# Patient Record
Sex: Female | Born: 1935 | Race: Black or African American | Hispanic: No | Smoking: Never smoker
Health system: Southern US, Community
[De-identification: ages and names within clinical notes are randomized; demographics above are authoritative.]

## PROBLEM LIST (undated history)

## (undated) DIAGNOSIS — I10 Essential (primary) hypertension: Secondary | ICD-10-CM

## (undated) DIAGNOSIS — I441 Atrioventricular block, second degree: Secondary | ICD-10-CM

## (undated) DIAGNOSIS — E119 Type 2 diabetes mellitus without complications: Secondary | ICD-10-CM

## (undated) DIAGNOSIS — I452 Bifascicular block: Secondary | ICD-10-CM

## (undated) HISTORY — DX: Atrioventricular block, second degree: I44.1

## (undated) HISTORY — DX: Bifascicular block: I45.2

---

## 1998-03-07 ENCOUNTER — Ambulatory Visit (HOSPITAL_COMMUNITY): Admission: RE | Admit: 1998-03-07 | Discharge: 1998-03-07 | Payer: Self-pay | Admitting: Family Medicine

## 2000-01-05 ENCOUNTER — Other Ambulatory Visit: Admission: RE | Admit: 2000-01-05 | Discharge: 2000-01-05 | Payer: Self-pay | Admitting: Family Medicine

## 2000-01-11 ENCOUNTER — Encounter: Payer: Self-pay | Admitting: Family Medicine

## 2000-01-11 ENCOUNTER — Encounter: Admission: RE | Admit: 2000-01-11 | Discharge: 2000-01-11 | Payer: Self-pay | Admitting: Family Medicine

## 2000-01-12 ENCOUNTER — Encounter: Payer: Self-pay | Admitting: Family Medicine

## 2000-01-12 ENCOUNTER — Ambulatory Visit (HOSPITAL_COMMUNITY): Admission: RE | Admit: 2000-01-12 | Discharge: 2000-01-12 | Payer: Self-pay | Admitting: Family Medicine

## 2000-07-09 ENCOUNTER — Encounter: Payer: Self-pay | Admitting: Emergency Medicine

## 2000-07-09 ENCOUNTER — Emergency Department (HOSPITAL_COMMUNITY): Admission: EM | Admit: 2000-07-09 | Discharge: 2000-07-09 | Payer: Self-pay | Admitting: Emergency Medicine

## 2002-06-26 ENCOUNTER — Other Ambulatory Visit: Admission: RE | Admit: 2002-06-26 | Discharge: 2002-06-26 | Payer: Self-pay | Admitting: Family Medicine

## 2002-07-06 ENCOUNTER — Encounter: Admission: RE | Admit: 2002-07-06 | Discharge: 2002-07-06 | Payer: Self-pay | Admitting: Family Medicine

## 2002-07-06 ENCOUNTER — Encounter: Payer: Self-pay | Admitting: Family Medicine

## 2004-06-29 ENCOUNTER — Encounter: Admission: RE | Admit: 2004-06-29 | Discharge: 2004-06-29 | Payer: Self-pay | Admitting: Family Medicine

## 2004-06-29 ENCOUNTER — Other Ambulatory Visit: Admission: RE | Admit: 2004-06-29 | Discharge: 2004-06-29 | Payer: Self-pay | Admitting: Family Medicine

## 2004-07-10 ENCOUNTER — Ambulatory Visit: Payer: Self-pay | Admitting: Gastroenterology

## 2004-07-24 ENCOUNTER — Ambulatory Visit: Payer: Self-pay | Admitting: Gastroenterology

## 2004-08-15 ENCOUNTER — Ambulatory Visit (HOSPITAL_COMMUNITY): Admission: RE | Admit: 2004-08-15 | Discharge: 2004-08-15 | Payer: Self-pay | Admitting: Family Medicine

## 2004-09-22 ENCOUNTER — Encounter (INDEPENDENT_AMBULATORY_CARE_PROVIDER_SITE_OTHER): Payer: Self-pay | Admitting: *Deleted

## 2004-09-22 ENCOUNTER — Ambulatory Visit (HOSPITAL_COMMUNITY): Admission: RE | Admit: 2004-09-22 | Discharge: 2004-09-22 | Payer: Self-pay | Admitting: Obstetrics & Gynecology

## 2004-10-26 ENCOUNTER — Encounter: Admission: RE | Admit: 2004-10-26 | Discharge: 2004-10-26 | Payer: Self-pay | Admitting: Family Medicine

## 2005-07-23 ENCOUNTER — Encounter: Admission: RE | Admit: 2005-07-23 | Discharge: 2005-07-23 | Payer: Self-pay | Admitting: Family Medicine

## 2006-06-03 ENCOUNTER — Other Ambulatory Visit: Admission: RE | Admit: 2006-06-03 | Discharge: 2006-06-03 | Payer: Self-pay | Admitting: Family Medicine

## 2006-06-14 ENCOUNTER — Ambulatory Visit (HOSPITAL_COMMUNITY): Admission: RE | Admit: 2006-06-14 | Discharge: 2006-06-14 | Payer: Self-pay | Admitting: Family Medicine

## 2006-12-10 ENCOUNTER — Ambulatory Visit (HOSPITAL_COMMUNITY): Admission: RE | Admit: 2006-12-10 | Discharge: 2006-12-11 | Payer: Self-pay | Admitting: Ophthalmology

## 2007-07-21 ENCOUNTER — Encounter: Admission: RE | Admit: 2007-07-21 | Discharge: 2007-07-21 | Payer: Self-pay | Admitting: Family Medicine

## 2008-04-26 ENCOUNTER — Encounter: Admission: RE | Admit: 2008-04-26 | Discharge: 2008-04-26 | Payer: Self-pay | Admitting: Family Medicine

## 2008-05-04 ENCOUNTER — Encounter: Admission: RE | Admit: 2008-05-04 | Discharge: 2008-05-04 | Payer: Self-pay | Admitting: Family Medicine

## 2008-05-10 ENCOUNTER — Emergency Department (HOSPITAL_COMMUNITY): Admission: EM | Admit: 2008-05-10 | Discharge: 2008-05-10 | Payer: Self-pay | Admitting: Emergency Medicine

## 2009-02-07 ENCOUNTER — Ambulatory Visit (HOSPITAL_COMMUNITY): Admission: RE | Admit: 2009-02-07 | Discharge: 2009-02-07 | Payer: Self-pay

## 2009-06-18 ENCOUNTER — Emergency Department (HOSPITAL_COMMUNITY): Admission: EM | Admit: 2009-06-18 | Discharge: 2009-06-18 | Payer: Self-pay | Admitting: Family Medicine

## 2009-06-22 ENCOUNTER — Ambulatory Visit: Payer: Self-pay | Admitting: Gynecology

## 2009-06-27 ENCOUNTER — Ambulatory Visit: Payer: Self-pay | Admitting: Gynecology

## 2009-08-24 ENCOUNTER — Ambulatory Visit: Payer: Self-pay | Admitting: Oncology

## 2009-09-01 ENCOUNTER — Other Ambulatory Visit: Admission: RE | Admit: 2009-09-01 | Discharge: 2009-09-01 | Payer: Self-pay | Admitting: Oncology

## 2009-09-01 LAB — MORPHOLOGY

## 2009-09-01 LAB — CHCC SMEAR

## 2009-09-01 LAB — COMPREHENSIVE METABOLIC PANEL
Albumin: 4.3 g/dL (ref 3.5–5.2)
Alkaline Phosphatase: 76 U/L (ref 39–117)
BUN: 14 mg/dL (ref 6–23)
Calcium: 9.6 mg/dL (ref 8.4–10.5)
Creatinine, Ser: 1.04 mg/dL (ref 0.40–1.20)
Glucose, Bld: 121 mg/dL — ABNORMAL HIGH (ref 70–99)
Potassium: 3.8 mEq/L (ref 3.5–5.3)

## 2009-09-01 LAB — CBC WITH DIFFERENTIAL/PLATELET
Basophils Absolute: 0 10*3/uL (ref 0.0–0.1)
Eosinophils Absolute: 0.1 10*3/uL (ref 0.0–0.5)
HGB: 12.9 g/dL (ref 11.6–15.9)
MCV: 92.9 fL (ref 79.5–101.0)
MONO#: 0.5 10*3/uL (ref 0.1–0.9)
MONO%: 8.1 % (ref 0.0–14.0)
NEUT#: 2.1 10*3/uL (ref 1.5–6.5)
RBC: 4.14 10*6/uL (ref 3.70–5.45)
RDW: 14.6 % — ABNORMAL HIGH (ref 11.2–14.5)
WBC: 5.9 10*3/uL (ref 3.9–10.3)
lymph#: 3.2 10*3/uL (ref 0.9–3.3)

## 2009-09-02 LAB — FLOW CYTOMETRY

## 2009-12-13 ENCOUNTER — Emergency Department (HOSPITAL_COMMUNITY): Admission: EM | Admit: 2009-12-13 | Discharge: 2009-12-13 | Payer: Self-pay | Admitting: Emergency Medicine

## 2010-02-28 ENCOUNTER — Ambulatory Visit: Payer: Self-pay | Admitting: Oncology

## 2010-05-28 ENCOUNTER — Encounter: Payer: Self-pay | Admitting: Family Medicine

## 2010-07-26 LAB — POCT URINALYSIS DIP (DEVICE)
Glucose, UA: NEGATIVE mg/dL
Nitrite: NEGATIVE
Urobilinogen, UA: 0.2 mg/dL (ref 0.0–1.0)
pH: 7 (ref 5.0–8.0)

## 2010-07-26 LAB — WET PREP, GENITAL
Clue Cells Wet Prep HPF POC: NONE SEEN
Trich, Wet Prep: NONE SEEN
Yeast Wet Prep HPF POC: NONE SEEN

## 2010-08-21 LAB — DIFFERENTIAL
Basophils Absolute: 0.2 10*3/uL — ABNORMAL HIGH (ref 0.0–0.1)
Basophils Relative: 4 % — ABNORMAL HIGH (ref 0–1)
Neutro Abs: 2.1 10*3/uL (ref 1.7–7.7)
Neutrophils Relative %: 46 % (ref 43–77)

## 2010-08-21 LAB — POCT I-STAT, CHEM 8
Calcium, Ion: 1.09 mmol/L — ABNORMAL LOW (ref 1.12–1.32)
Glucose, Bld: 101 mg/dL — ABNORMAL HIGH (ref 70–99)
HCT: 40 % (ref 36.0–46.0)
TCO2: 27 mmol/L (ref 0–100)

## 2010-08-21 LAB — CBC
MCHC: 33.3 g/dL (ref 30.0–36.0)
RDW: 14.9 % (ref 11.5–15.5)

## 2010-08-21 LAB — POCT CARDIAC MARKERS
CKMB, poc: 2.4 ng/mL (ref 1.0–8.0)
Troponin i, poc: <0.05 ng/mL (ref 0.00–0.09)

## 2010-08-21 LAB — PROTIME-INR
INR: 1 (ref 0.00–1.49)
Prothrombin Time: 13.8 seconds (ref 11.6–15.2)

## 2010-08-21 LAB — ABO/RH: ABO/RH(D): O POS

## 2010-08-21 LAB — TYPE AND SCREEN: ABO/RH(D): O POS

## 2010-09-19 NOTE — Op Note (Signed)
Wendy Yang, Wendy Yang             ACCOUNT NO.:  [id number]   MEDICAL RECORD NO.:  [id number]          PATIENT TYPE:  AMB   LOCATION:  SDS                          FACILITY:  [facility]   PHYSICIAN:  John D. Ashley Royalty, M.D. DATE OF BIRTH:  Oct 03, 1925   DATE OF PROCEDURE:  12/10/2006  DATE OF DISCHARGE:                               OPERATIVE REPORT   ADMISSION DIAGNOSIS:  Retained lens material, posterior vitreous  membranes, right eye.   OPERATION/PROCEDURE:  Pars plana vitrectomy with membrane peel, removal  of lens material, retinal photocoagulation.   SURGEON:  Beulah Gandy. Ashley Royalty, M.D.   ASSISTANT:  Rosalie Doctor, MA   ANESTHESIA:  General.   DESCRIPTION OF PROCEDURE:  Usual prep and drape, 25-gauge trocars placed  at 8, 10 and 2 o'clock, the infusion port placed at 8 o'clock.  The  indirect ophthalmoscope laser was moved into place.  Two rows of laser  were placed around the retinal periphery with a power of 660 milliwatts,  418 spots in number, 1000 microns each and 0.1 seconds each.  Provisc  was placed on the corneal surface and the Biome was brought into place.  The pars plana vitrectomy was begun just behind the pseudophakos lens.  Fluffy white material was removed from the posterior surface of the lens  and from the periphery of the lens.  Scleral depression was used to  remove to discover all lens material in the anterior segment.  This was  all removed.  The vitrectomy is carried posteriorly and large white  pearly chunks were seen in the vitreous cavity.  These were carefully  removed under low suction and rapid cutting.  Some nuclear fragments  were also seen and removed with the 25-gauge cutter and the pick  membranes were attached to the disk and to the periphery.  These  membranes were peeled with a 25-gauge pick and removed with the vitreous  cutters.  The vitrectomy was carried out of the far periphery where  white granular material was seen in sheets along the  vitreous membranes.  These were carefully removed under low suction and rapid cutting down to  the vitreous base.  Once the entire vitreous cavity was cleansed, a  washout procedure was performed.  The instruments were removed from the  eye and the 25-gauge trocars were removed.  Closing pressure was 15 with  a Matt Holmes, a Baer keratometer.  The scleral wounds were held until tight.  Polymyxin and gentamicin were irrigated 15 ounce space.  Marcaine was  injected around the globe for postop pain.  Decadron 10 mg was injected  in the lower subconjunctival space.  TobraDex ophthalmic ointment, patch  and shield were placed.  The patient was awakened and taken to recovery  in satisfactory condition.      Beulah Gandy. Ashley Royalty, M.D.  Electronically Signed     JDM/MEDQ  D:  12/10/2006  T:  12/11/2006  Job:  [id number]

## 2010-09-22 NOTE — Op Note (Signed)
Wendy Yang, Wendy Yang             ACCOUNT NO.:  [id number]   MEDICAL RECORD NO.:  [id number]          PATIENT TYPE:  AMB   LOCATION:  SDC                           FACILITY:  [facility]   PHYSICIAN:  Ilda Mori, M.D.   DATE OF BIRTH:  04-Jul-1925   DATE OF PROCEDURE:  09/22/2004  DATE OF DISCHARGE:                                 OPERATIVE REPORT   PREOPERATIVE DIAGNOSES:  Postmenopausal bleeding, possible endometrial  polyp.   POSTOPERATIVE DIAGNOSIS:  Postmenopausal bleeding.   PROCEDURE:  Hysteroscopy D&C.   SURGEON:  Dr. Ilda Mori   ANESTHESIA:  IV sedation with paracervical block.   ESTIMATED BLOOD LOSS:  Minimal.   FINDINGS:  There was possibly a small endometrial polyp noted on the  anterior surface of the endometrial cavity.  The rest of the endometrium  appeared atrophic.   SPECIMENS:  Endometrial curettings.   INDICATIONS:  This is a 77 year old, gravida 6, para 6-0-0-5, who was  evaluated in the office approximately 1 week ago with a history of sudden  onset of moderately heavy vaginal bleeding.  A office Pipelle was done which  revealed benign endometrial tissue and possible polypoid tissue.  Because of  the significance of the bleed and the polypoid tissue seen on Pipelle, the  decision was made to proceed with a hospital hysteroscopy D&C to rule out  polyp or possibly cancer.   DESCRIPTION OF PROCEDURE:  The patient was taken to the operating room, and  IV sedation was administered.  She was placed in dorsolithotomy position.  The vagina and vulva were prepped and draped in a sterile fashion.  Then 20  mL of 1% lidocaine was placed in the paracervical tissues.  The uterus was  then sounded to 8 cm, dilated to a 25 [place], and hysteroscope was  introduced, and the endometrial cavity was viewed, and a small 1/2 possible  polypoid mass was seen less than 0.5 cm in the anterior portion of the  endometrial cavity.  Polyp forceps was introduced which failed to  reveal any  tissue.  A sharp curettage was then systematically carried out, and  all the tissue that was removed was sent for pathological evaluation.  The  hysteroscope was reintroduced, and the small polyp that had been identified  earlier could no longer be seen.  The procedure was then terminated, and the  patient left the operating room in good condition.      RK/MEDQ  D:  09/22/2004  T:  09/22/2004  Job:  [id number]   cc:   Quita Skye. Artis Flock, M.D.  [address]  [place]  [place] [address]  Fax: [phone]

## 2011-02-19 LAB — BASIC METABOLIC PANEL
BUN: 9
CO2: 29
Calcium: 9.6
GFR calc Af Amer: >60
Glucose, Bld: 120 — ABNORMAL HIGH
Sodium: 142

## 2011-02-19 LAB — CBC
HCT: 39.3
Hemoglobin: 13.1
MCHC: 33.2
RDW: 14.4 — ABNORMAL HIGH

## 2011-08-21 ENCOUNTER — Encounter: Payer: Self-pay | Admitting: Family Medicine

## 2011-08-21 ENCOUNTER — Ambulatory Visit (INDEPENDENT_AMBULATORY_CARE_PROVIDER_SITE_OTHER): Payer: Medicaid Other | Admitting: Family Medicine

## 2011-08-21 VITALS — BP 172/92 | HR 60 | Ht 63.5 in | Wt 199.0 lb

## 2011-08-21 DIAGNOSIS — E119 Type 2 diabetes mellitus without complications: Secondary | ICD-10-CM

## 2011-08-21 DIAGNOSIS — I10 Essential (primary) hypertension: Secondary | ICD-10-CM

## 2011-08-21 NOTE — Progress Notes (Addendum)
Subjective:     Patient ID: Wendy Yang, female   DOB: Sep 07, 1925, 78 y.o.   MRN: [id number]  HPI Here to establish care. No complaints, however reports chronic: R leg swelling - takes Lasix as needed (on average, twice weekly) for swelling. R arm/R leg tingling - throughout the day. Feels as if 'arm fell asleep'. Allergies/runny nose Occasional headaches - treated with ibuprofen  PMH:  1. Osteoarthritis in BL shoulders - ibuprofen if needed 2. Diagnosed with DMII in 2011 3. HTN 4. Reflux - hiatal hernia - last month was last episode. Takes zantac 5. Hx of migraines - now resolved 6. Hx of stomach ulcer (1950)  Meds: Multivitamin Vit B, vit D Organic amla fruit powder Lasix - twice/wk as needed Zantac - as needed for heartburn  In past, was on:  Metformin Simvastatin omemprazole  Allergies: NKDA  Surgeries: Cholecystectomy - 1994 Tubal ligation - 1954 R shoulder; rotator cuff - 2006  FHX: PGF - Jaw cancer PGM - Blister on foot, developed into cancer Mother - Heart attack Alcohol/drug abuse - brother  SHX: Lives alone. Independent. No concern of trouble with ADLs. Retired. Daughter is a patient. No hx of tobacco use.  FMTS Attending Note  Patient seen and examined by me with medical student Elinor Parkinson, [facility] MS3.  Patient presents today to establish care.  Previously patient of Dr Ronne Binning, switching doctors due to insurance issues.  Offers no complaints, but does give PMHx as listed above.  Currently, some cervical/nuchal/shoulder pain from time to time, well controlled. Suffers from spring allergies which are giving watery rhinorrhea, withotu cough, fevers or chills.  She states she does have occasional acute bouts of "heaviness in chest" which last for a matter of seconds, then resolve on their own.  No chest pressure, no palpitations, no presyncope.  She has never had a diagnosis of "irregular heart beat' or atrial fib.    Review of Systems       Objective:   Physical Exam Gen: Well appearing, very pleasant. NAD Well appearing. JB HEENT: NCAT, EOMI, PERRL, MMM. Hearing aids in BL ears. TMs clear. Oropharynx clear. Carotids without bruits. CV: Regular rate. Regular S1S2, no extra sounds or murmurs. JB Lungs: CTAB, normal WOB Clear lung fields bilaterally, no rales or wheezes. JB Ext: 2+ pitting edema in R leg. 1+ in L leg. Neuro: Sensation grossly intact in all extremities. 4/4 monofilament exam in BL feet. During exam, patient had acute onset of "shortness of breath" episode which lasted for approximately 15 seconds.  I checked her pulse during this spell, regular and not tacchycardic.     Assessment:         Plan:     Ms. Depasquale is a very pleasant 78 year old. She is somewhat resistant to medications, as she openly admits. Given her PMH of DM and HTN, this is likely going to be the focus of efforts in the first visit. She can follow up if her tingling/swelling do not subside.  1. DMII: Will check HgA1c and may consider starting metformin by next visit. Stress that adequate glucose control will help minimize tingling. She gets eye exams yearly. Encouraged daily foot exams. Labs: Today - fasting Baseline BMP, HgA1c. 2. Elevated BP: with hx of HTN. Recheck today 168/90. Will start on ACEI. 3. Begin 81mg  aspirin for stroke prevention.

## 2011-08-21 NOTE — Patient Instructions (Signed)
It was a pleasure to meet you today.  I am glad to be your new doctor.   As we discussed, I am checking labs on you today and will call you tomorrow at [phone] to discuss results.  If your kidney function is okay, I would like to start you on a blood pressure medication after seeing the values.   I recommend an aspirin (enteric coated) 81mg  daily for heart disease/stroke prevention.   I would like to see you back in 1 month, or sooner if needed.  For your nasal symptoms/allergies, I recommend over-the-counter loratadine 10mg  daily.

## 2011-08-22 ENCOUNTER — Telehealth: Payer: Self-pay | Admitting: Family Medicine

## 2011-08-22 DIAGNOSIS — I10 Essential (primary) hypertension: Secondary | ICD-10-CM

## 2011-08-22 LAB — COMPREHENSIVE METABOLIC PANEL
ALT: 8 U/L (ref 0–35)
AST: 18 U/L (ref 0–37)
Creat: 0.88 mg/dL (ref 0.50–1.10)
Total Bilirubin: 0.5 mg/dL (ref 0.3–1.2)

## 2011-08-22 LAB — CBC
MCH: 29.6 pg (ref 26.0–34.0)
MCV: 93.8 fL (ref 78.0–100.0)
Platelets: 212 10*3/uL (ref 150–400)
RDW: 14.2 % (ref 11.5–15.5)
WBC: 4 10*3/uL (ref 4.0–10.5)

## 2011-08-22 LAB — LIPID PANEL
Total CHOL/HDL Ratio: 3.8 Ratio
VLDL: 13 mg/dL (ref 0–40)

## 2011-08-22 MED ORDER — LISINOPRIL-HYDROCHLOROTHIAZIDE 20-25 MG PO TABS: 1.0000 | ORAL_TABLET | Freq: Every day | ORAL | Status: DC

## 2011-08-22 MED ORDER — SIMVASTATIN 20 MG PO TABS: 20.0000 mg | ORAL_TABLET | Freq: Every day | ORAL | Status: DC

## 2011-08-22 NOTE — Assessment & Plan Note (Signed)
Patient with DM, well controlled with A1C 6.2%.  Plan to continue current regimen.  Recommended ASA 81mg  daily.  Monofilament test of feet unremarkable today. JB

## 2011-08-22 NOTE — Assessment & Plan Note (Signed)
Patient with notable hypertension, plan to check metabolic panel/renal function before starting ACEI/HCTZ and rechecking BMet in the coming month. Will call her with results of labs.

## 2011-08-22 NOTE — Telephone Encounter (Signed)
I called patient to report results of her labs from yesterday.  She has normal renal function and elevated LDL.  Plan to start lisinopril/HCTZ, as well as simvastatin.  She has been off metformin and simvastatin since she ran out in December.   Plan:  1. Patient to start meds (sent to [facility]). 2. She is to call for RN visit and BMet (lab), nonfasting, in the coming 2 weeks.  3. She is to have direct LDL drawn in 2 months.

## 2011-08-29 ENCOUNTER — Other Ambulatory Visit: Payer: Medicare HMO

## 2011-08-29 DIAGNOSIS — I10 Essential (primary) hypertension: Secondary | ICD-10-CM

## 2011-08-29 LAB — BASIC METABOLIC PANEL
BUN: 21 mg/dL (ref 6–23)
Potassium: 4.1 mEq/L (ref 3.5–5.3)
Sodium: 139 mEq/L (ref 135–145)

## 2011-08-29 NOTE — Progress Notes (Signed)
Bmp done today Wendy Yang 

## 2011-08-30 ENCOUNTER — Encounter: Payer: Self-pay | Admitting: Family Medicine

## 2011-08-30 DIAGNOSIS — I10 Essential (primary) hypertension: Secondary | ICD-10-CM

## 2011-09-25 ENCOUNTER — Ambulatory Visit (INDEPENDENT_AMBULATORY_CARE_PROVIDER_SITE_OTHER): Payer: Medicare HMO | Admitting: Family Medicine

## 2011-09-25 ENCOUNTER — Encounter: Payer: Self-pay | Admitting: Family Medicine

## 2011-09-25 VITALS — BP 142/80 | HR 56 | Ht 63.5 in | Wt 194.0 lb

## 2011-09-25 DIAGNOSIS — I10 Essential (primary) hypertension: Secondary | ICD-10-CM

## 2011-09-25 DIAGNOSIS — E7800 Pure hypercholesterolemia, unspecified: Secondary | ICD-10-CM

## 2011-09-25 DIAGNOSIS — R071 Chest pain on breathing: Secondary | ICD-10-CM

## 2011-09-25 DIAGNOSIS — R0789 Other chest pain: Secondary | ICD-10-CM

## 2011-09-25 DIAGNOSIS — E785 Hyperlipidemia, unspecified: Secondary | ICD-10-CM

## 2011-09-25 LAB — BASIC METABOLIC PANEL
CO2: 28 mEq/L (ref 19–32)
Glucose, Bld: 99 mg/dL (ref 70–99)
Potassium: 4.2 mEq/L (ref 3.5–5.3)
Sodium: 142 mEq/L (ref 135–145)

## 2011-09-25 MED ORDER — CETIRIZINE HCL 10 MG PO TABS: 10.0000 mg | ORAL_TABLET | Freq: Every day | ORAL | Status: DC

## 2011-09-25 NOTE — Patient Instructions (Addendum)
It was a pleasure to see you today.   Your blood pressure is well controlled today.  Keep taking the lisinopril/HCTZ as you have been taking it.   I am checking a metabolic panel to be sure your kidney function and potassium are still okay after starting the medicine.   For the check wall pain, I suspect a muscular cause.  I recommend OTC ibuprofen 200mg  tablets, take 2 to 3 tablets with something to eat, every 8 hours as needed for the pain, for the coming 2 weeks.  Also, heat packs to the area.  If these measures do not help in the coming 2 weeks, please call me.   I sent a prescription for Cetirizine (Zyrtec) 10mg  tablets, one tablet daily, for allergies.  This may be affecting your shortness of breath.  If not better with this medicine, please call and I think we should consider whether a chest x-ray is necessary.   You may take Zantac over the counter for the reflux symptoms at night.    i WOULD LIKE YOU TO COME FOR A NONFASTING LDL CHOLESTEROL CHECK IN ABOUT 1 MONTH, LAB-APPOINTMENT ONLY (MID-June).

## 2011-09-26 ENCOUNTER — Telehealth: Payer: Self-pay | Admitting: Family Medicine

## 2011-09-26 DIAGNOSIS — R0789 Other chest pain: Secondary | ICD-10-CM | POA: Insufficient documentation

## 2011-09-26 DIAGNOSIS — E7800 Pure hypercholesterolemia, unspecified: Secondary | ICD-10-CM | POA: Insufficient documentation

## 2011-09-26 NOTE — Progress Notes (Signed)
  Subjective:    Patient ID: Wendy Yang, female    DOB: June 10, 1925, 78 y.o.   MRN: [id number]  HPI Patient here for follow up of her blood pressure.  She has felt generally well; has some complaints of R mid back pain, that is worse with deep breaths (x1 month).  No cough, no fevers or chills, no sputum production.  Never been a smoker. Ibuprofen helps.  Other ROS: denies leg/calf pain, no history VTE events.  No sick contacts. Associates onset with finding of mold and standing water in her bedroom from plumbing problem in her house in March.   Ran out of lisinopril/HCTZ 4 days ago, has not gone for refills (explains that she has 11 months' refills). No cough since starting the med.  Home BP checks range 127/97, 120/97.  No chest pain or pressure.    Review of SystemsSee above     Objective:   Physical Exam Alert, well appearing, no apparent distress HEENT Neck supple, no adenopathy.  COR S1S2, no extra sounds or murmurs.  PULM Clear bilaterally.  CHEST WALL: Reproducible tenderness along R chest wall @rib #7-9 posterolateral; no ecchymosis, no crepitus.  No paraspinous pain.   ABD Soft, nontender, no organomegaly.  LEs: No calf tenderness or disparate calf girth.  Palpable dp pulses bialterally. No popliteal fossa masses or tenderness.        Assessment & Plan:

## 2011-09-26 NOTE — Assessment & Plan Note (Signed)
Well controlled from BP standpoint.  To recheck K and Cr today, as patient recently initiated ACEi therapy.

## 2011-09-26 NOTE — Telephone Encounter (Signed)
Called patient and discussed findings of BMet done yesterday.  Reinforced plan to check LDL cholesterol in 1 more month.  Paula Compton, MD

## 2011-12-28 ENCOUNTER — Encounter: Payer: Self-pay | Admitting: Family Medicine

## 2011-12-28 ENCOUNTER — Ambulatory Visit (INDEPENDENT_AMBULATORY_CARE_PROVIDER_SITE_OTHER): Payer: Medicare HMO | Admitting: Family Medicine

## 2011-12-28 VITALS — BP 121/75 | HR 60 | Ht 63.5 in | Wt 195.0 lb

## 2011-12-28 DIAGNOSIS — R0981 Nasal congestion: Secondary | ICD-10-CM | POA: Insufficient documentation

## 2011-12-28 DIAGNOSIS — J3489 Other specified disorders of nose and nasal sinuses: Secondary | ICD-10-CM

## 2011-12-28 DIAGNOSIS — E7800 Pure hypercholesterolemia, unspecified: Secondary | ICD-10-CM

## 2011-12-28 DIAGNOSIS — E119 Type 2 diabetes mellitus without complications: Secondary | ICD-10-CM

## 2011-12-28 DIAGNOSIS — I10 Essential (primary) hypertension: Secondary | ICD-10-CM

## 2011-12-28 LAB — POCT GLYCOSYLATED HEMOGLOBIN (HGB A1C): Hemoglobin A1C: 6.5

## 2011-12-28 MED ORDER — MONTELUKAST SODIUM 10 MG PO TABS: 10.0000 mg | ORAL_TABLET | Freq: Every day | ORAL | Status: DC

## 2011-12-28 NOTE — Patient Instructions (Addendum)
It was great seeing you today  We are going to start you on a baby aspirin today.  Singulair one tablet daily for nasal symptoms. Call back if not bette rin the coming 4 weeks.   I will contact you with the results of today's labs.

## 2011-12-28 NOTE — Assessment & Plan Note (Signed)
Much improved, at goal.  To add EC ASA 81mg  daily, I explained why this is not likely to affect her remote history of gastric ulcers.  Continue current treatment.  I do not think the lisinopril is the culprit for her longstanding clear rhinorrhea and cough.

## 2011-12-28 NOTE — Progress Notes (Signed)
  Subjective:    Patient ID: Wendy Yang, female    DOB: [birth year], 78 y.o.   MRN: [id number]  HPI Patient seen in conjunction with Billey Gosling, MS3 [facility].  Ms. Bloch is here for follow up.  Has continued with dry cough and periodic hoarseness that has been ongoing for over a year.  Does not seem to have changed with addition of lisinopril at last visit in the Spring.  No chest pain, no dyspnea.  No fevers or chills.  Continues with copious clear rhinorrhea that bothers her constantly. She does not derive any benefit from Zyrtec, prescribed here.  She has tried nasal steroid sprays in the past with previous doctor but doesn't recall their names, only that they did not help at all.    She is a never-smoker, does not have contact with smoke.  No known triggers to her symptoms.    Review of Systems See above    Objective:   Physical Exam Well appearing, no apparent distress HEENT neck supple. Boggy nasal mucosa with clear watery discharge.  No maxillary or frontal sinus tenderness. TMs clear bilat. No cobblestoning in oropharnyx, which appears clear.  COR Regular S1S2 PULM  Clear bilat.  EXTS monofilament testing with good sensation at all points, no maceration in interdigitary skin. No edema. Palpable DP pulses.        Assessment & Plan:

## 2011-12-28 NOTE — Assessment & Plan Note (Signed)
To recheck A1c today.  Has been well controlled.

## 2011-12-28 NOTE — Assessment & Plan Note (Signed)
Reports she has failed inhaled prescription nasal steroids in the past (unsure which ones or when).  Zyrtec makes her "worse".  Will try singulair at this time, due to failure of inhaled nasal steroids and nonsedating antihistamines.  Cannot identify triggers.  May need to try a nasal steroid another time if Prior Berkley Harvey is demanded of her insurance company.

## 2011-12-28 NOTE — Assessment & Plan Note (Signed)
Tolerating statin. To recheck LDL direct today.

## 2012-01-02 ENCOUNTER — Telehealth: Payer: Self-pay | Admitting: Family Medicine

## 2012-01-02 NOTE — Telephone Encounter (Signed)
Called and spoke with patient; her LDL is 102, which is close to a guideline cutoff of 100.  She is to continue her simvastatin.  Paula Compton, MD

## 2012-05-05 ENCOUNTER — Ambulatory Visit (INDEPENDENT_AMBULATORY_CARE_PROVIDER_SITE_OTHER): Payer: Medicare HMO | Admitting: Family Medicine

## 2012-05-05 ENCOUNTER — Ambulatory Visit (HOSPITAL_COMMUNITY)
Admission: RE | Admit: 2012-05-05 | Discharge: 2012-05-05 | Disposition: A | Discharge status: Home / Self Care | Payer: Medicare HMO | Source: Ambulatory Visit | Attending: Family Medicine | Admitting: Family Medicine

## 2012-05-05 VITALS — BP 145/79 | HR 80 | Temp 98.2°F | Ht 63.5 in | Wt 195.0 lb

## 2012-05-05 DIAGNOSIS — R220 Localized swelling, mass and lump, head: Secondary | ICD-10-CM

## 2012-05-05 DIAGNOSIS — K130 Diseases of lips: Secondary | ICD-10-CM

## 2012-05-05 DIAGNOSIS — M792 Neuralgia and neuritis, unspecified: Secondary | ICD-10-CM

## 2012-05-05 DIAGNOSIS — Z205 Contact with and (suspected) exposure to viral hepatitis: Secondary | ICD-10-CM | POA: Insufficient documentation

## 2012-05-05 DIAGNOSIS — IMO0002 Reserved for concepts with insufficient information to code with codable children: Secondary | ICD-10-CM

## 2012-05-05 DIAGNOSIS — Z23 Encounter for immunization: Secondary | ICD-10-CM

## 2012-05-05 DIAGNOSIS — Z20828 Contact with and (suspected) exposure to other viral communicable diseases: Secondary | ICD-10-CM

## 2012-05-05 DIAGNOSIS — M542 Cervicalgia: Secondary | ICD-10-CM | POA: Insufficient documentation

## 2012-05-05 DIAGNOSIS — M5090 Cervical disc disorder, unspecified, unspecified cervical region: Secondary | ICD-10-CM | POA: Insufficient documentation

## 2012-05-05 NOTE — Patient Instructions (Addendum)
Thank you for coming in today, it was good to see you For your arm numbness: I think this may be coming from your neck.  Get the xray of your neck, I will let you know the results when I receive this back For the lip swelling:  Record what you had to eat or any new products you used so that we can get an idea that may be causing your symptoms.  Use 25-50 mg of benadryl if you have another episode For the exposure to Hep A: You will likely not develop infection from this.  Things to look out for are yellowing of the eyes, abdominal bloating, abdominal pain, nausea, itching of skin all over Please call with questions.  Have a good New Year!

## 2012-05-06 LAB — COMPREHENSIVE METABOLIC PANEL
AST: 16 U/L (ref 0–37)
Albumin: 4.2 g/dL (ref 3.5–5.2)
BUN: 19 mg/dL (ref 6–23)
Calcium: 9.5 mg/dL (ref 8.4–10.5)
Chloride: 100 mEq/L (ref 96–112)
Potassium: 4.1 mEq/L (ref 3.5–5.3)
Total Protein: 6.7 g/dL (ref 6.0–8.3)

## 2012-05-07 NOTE — Assessment & Plan Note (Signed)
Symptoms sound radicular in nature. Mild pain, numbness seems to be the most bothersome.  Will send for neck films.  Would try conservative therapy initially with steroids and PT if this continues.

## 2012-05-07 NOTE — Progress Notes (Signed)
  Subjective:    Patient ID: Wendy Yang, female    DOB: [birth year], 79 y.o.   MRN: [id number]  HPI Comes in today with multiple complains  1.  R arm numbness:  This has been going on x 1 month.  This comes and goes. Numbness includes entire arm to fingers.  Does not have today.  She does endorse some neck pain on the R side that is worse when she has the numbness.  She has a remote history of rotator cuff surgery.  Her strength is not affected.      2. Lip swelling:  Has had occasional upper and lower lip swelling  Began one month ago, with 2-3 episode, last episode 1 week ago. Also has had some throat scratchiness and some hives on her leg associated with this .  Episodes typically resolve on their own. She has not changed anything or tried any new foods that she is aware of.    3. Exposure to Hep A:  Patient reports that youngest daughter was diagnosed with Hep A recently since returning home to [place].  Patient had exposure to daughter during holiday and was wondering if there is anything she needs to do.  She currently has no symptoms including abdominal pain, icterus or yellowing of skin   Review of Systems Per HPI    Objective:   Physical Exam  Constitutional: She appears well-nourished. No distress.  HENT:  Head: Normocephalic and atraumatic.       No lip swelling seen today.   Eyes: No scleral icterus.  Neck: Neck supple.  Cardiovascular: Normal rate, regular rhythm and normal heart sounds.   Pulmonary/Chest: Effort normal and breath sounds normal. No respiratory distress. She has no wheezes.  Abdominal: Soft. She exhibits no distension.       Liver feels normal in size   Musculoskeletal: She exhibits no edema.       Some spasm and tenderness along R side of neck.  Negative spurlings.  Negative impingement signs.  Strength 5/5 bilaterally. No parasthesias endorsed today on exam.   Skin:       No jaundice           Assessment & Plan:

## 2012-05-07 NOTE — Assessment & Plan Note (Signed)
Given close exposure will vaccinate with Hep A vaccine.  She is relatively healthy and asymptomatic, so will not give IG at this time. Will check CMET to evaluate LFT's.  Advised to let other family members in household know of daughters diagnosis.

## 2012-05-07 NOTE — Assessment & Plan Note (Signed)
Sounds like allergic reaction.  Unknown cause.  Advised if this occurs again she should take benadryl.  Also instructed to keep log of things she ate, exposure to other items such as lip gloss, lip stick , etc. When she has symptoms so that maybe we can figure out what may be causing this.

## 2012-05-20 ENCOUNTER — Ambulatory Visit (INDEPENDENT_AMBULATORY_CARE_PROVIDER_SITE_OTHER): Payer: Commercial Managed Care - HMO | Admitting: Family Medicine

## 2012-05-20 VITALS — BP 139/60 | HR 78 | Temp 97.3°F | Ht 63.5 in | Wt 192.1 lb

## 2012-05-20 DIAGNOSIS — R29810 Facial weakness: Secondary | ICD-10-CM

## 2012-05-20 NOTE — Patient Instructions (Addendum)
Will check MRI to evaluate for stroke or other causes. Continue taking medication including aspirin. Make appointment for check up in 2 days. If you have worsening symptoms, trouble speaking/swallowing, headache, vision changes or any concerns then go to the emergency department.    Stroke (Cerebrovascular Accident) A stroke (cerebrovascular accident, CVA) means you have a brain injury from blocked circulation or bleeding in the brain. Blocked circulation usually comes from a clot. RISK FACTORS  High blood pressure (hypertension).   High cholesterol.   Diabetes.   Heart disease.   The buildup of fatty deposits in the blood vessels (peripheral artery disease or atherosclerosis).   An abnormal heart rhythm (atrial fibrillation).   Obesity.   Smoking.   Taking oral contraceptives (especially in combination with smoking).   Physical inactivity.   A diet high in fats, salt (sodium), and calories.   Alcohol use.   Use of illegal drugs (especially cocaine and methamphetamine).   Being a female.   Being an Tree surgeon.   Age over 11.   Family history of stroke.   Previous history of blood clots, a "warning stroke" (transient ischemic attack, TIA), or heart attack.   Sickle cell disease.  SYMPTOMS  The symptoms of a stroke depend on the part of the brain that is affected. It is important to seek treatment within 4 hours of the start of symptoms because you may receive a "clot dissolving" medication that cannot be given after that time. Even if you don't know when your symptoms began, get treatment as soon as possible. Symptoms of a stroke may progress or change over the first several days. Symptoms may include:  Sudden weakness or numbness of the face, arm, or leg, especially on one side of the body.   Sudden confusion.   Trouble speaking (aphasia) or understanding.   Sudden trouble seeing in one or both eyes.   Sudden trouble walking.   Dizziness.   Loss of  balance or coordination.   Sudden severe headache with no known cause.  HOME CARE INSTRUCTIONS   Medicines: Aspirin and blood thinners may be used to prevent another stroke. Blood thinners need to be used exactly as instructed. Medicines may also be used to control risk factors for a stroke. Be sure you understand all your medicine instructions.   Diet: Certain diets may be prescribed to address high blood pressure, high cholesterol, diabetes, or obesity. A diet that includes 5 or more servings of fruits and vegetables a day may reduce the risk of stroke. Foods may need to be a special consistency (soft or pureed), or small bites may need to be taken in order to avoid aspirating or choking.   Maintain a healthy weight.   Stay physically active. It is recommended that you get at least 30 minutes of activity on most or all days.   Do not smoke.   Limit alcohol use.   Stop drug abuse.   Home safety: A safe home environment is important to reduce the risk of falls. Your caregiver may arrange for specialists to evaluate your home. Having grab bars in the bedroom and bathroom is often important. Your caregiver may arrange for special equipment to be used at home, such as raised toilets and a seat for the shower.   Physical, occupational, and speech therapy: Ongoing therapy may be needed to maximize your recovery after a stroke. If you have been advised to use a walker or a cane, use it at all times. Be sure to keep  your therapy appointments.   Follow all instructions for follow-up with your caregiver. This is VERY important. This includes any referrals, physical therapy, rehabilitation, and laboratory tests. Proper treatment also prevents another stroke from occurring.  SEEK IMMEDIATE MEDICAL CARE IF:   You have sudden weakness or numbness of the face, arm, or leg, especially on one side of the body.   You have sudden confusion.   You have trouble speaking (aphasia) or understanding.   You  have sudden trouble seeing in one or both eyes.   You have sudden trouble walking.   You have dizziness.   You have loss of balance or coordination.   You have a sudden, severe headache with no known cause.   You have a fever.   You are coughing or have difficulty breathing.   You have new chest pain, angina, or an irregular heartbeat.  Any of these symptoms may represent a serious problem that is an emergency. Do not wait to see if the symptoms will go away. Get medical help at once. Call your local emergency services (911 in [place] Do not drive yourself to the hospital. Document Released: 04/23/2005 Document Revised: 11/06/2010 Document Reviewed: 09/21/2009 [facility] Patient Information 2012 [place], [place].

## 2012-05-20 NOTE — Assessment & Plan Note (Addendum)
Resolved, unsure if the finding on exam represents true deficit given the extreme subtlety. Seems suspicious for a TIA. No history of stroke previously. Recommend continue on aspirin and statin, ACEi. Will proceed with outpatient workup including MRI to evaluate for CVA and carotid doppler. Pending results, consider additional echo. Her risk factor labs recently were negative. Advised patient seek emergency care if any sign of weakness, speech/swallowing difficulty or facial droop returns. She agrees to f/u in clinic in 2 days or call for problems.  Case d/w Dr. Earnest Bailey.

## 2012-05-20 NOTE — Progress Notes (Signed)
  Subjective:    Patient ID: Wendy Yang, female    DOB: 1926-01-04, 79 y.o.   MRN: [id number]  HPI  1. Facial droop/swelling. Patient noted upon awakening yesterday that her left face was slightly swollen, and thought was drooping also. She was in [place] with her family on vacation. They monitored during the daytime yesterday and felt it had improved. She came back in a car today, and decided this was concerning enough to see doctor. Today has been uneventful. Had a slight pain in her left neck yesterday that resolved.  She denies any trouble with speech, swallowing, body-arm/leg numbness or weakness, balance issues, difficulty walking, falls.  No history of stroke. Taking baby aspirin and compliant with other medications.  Review of Systems See HPI otherwise negative.  reports that she has never smoked. She has never used smokeless tobacco.     Objective:   Physical Exam  Vitals reviewed. Constitutional: She is oriented to person, place, and time. She appears well-developed and well-nourished. No distress.  HENT:  Head: Normocephalic and atraumatic.  Mouth/Throat: Oropharynx is clear and moist. No oropharyngeal exudate.       Subtle finding of possible right facial droop-limited to the crease of her mouth and eyelid symmetry. This appears to resolve with repeat testing.   Eyes: EOM are normal. Pupils are equal, round, and reactive to light.  Neck: Normal range of motion. Neck supple.       No bruits  Cardiovascular: Normal rate, regular rhythm, normal heart sounds and intact distal pulses.   No murmur heard. Pulmonary/Chest: Effort normal and breath sounds normal. No respiratory distress. She has no wheezes.  Musculoskeletal: She exhibits no edema and no tenderness.  Lymphadenopathy:    She has no cervical adenopathy.  Neurological: She is alert and oriented to person, place, and time.       CN intact including sensation, except for the intermittent slight mouth crease and  eyelid asymmetry.  Finger-nose intact. Romberg negative. 5/5 UE and LE strength. Symmetric bicep and ankle reflexes. Gait normal. Speech normal.  Skin: No rash noted. She is not diaphoretic.  Psychiatric: She has a normal mood and affect.        Assessment & Plan:

## 2012-05-21 ENCOUNTER — Other Ambulatory Visit (HOSPITAL_COMMUNITY): Payer: Self-pay | Admitting: Family Medicine

## 2012-05-21 ENCOUNTER — Telehealth: Payer: Self-pay | Admitting: Family Medicine

## 2012-05-21 ENCOUNTER — Ambulatory Visit (HOSPITAL_COMMUNITY)
Admission: RE | Admit: 2012-05-21 | Discharge: 2012-05-21 | Disposition: A | Discharge status: Home / Self Care | Payer: Medicare HMO | Source: Ambulatory Visit | Attending: Family Medicine | Admitting: Family Medicine

## 2012-05-21 ENCOUNTER — Ambulatory Visit (HOSPITAL_BASED_OUTPATIENT_CLINIC_OR_DEPARTMENT_OTHER)
Admission: RE | Admit: 2012-05-21 | Discharge: 2012-05-21 | Disposition: A | Discharge status: Home / Self Care | Payer: Medicare HMO | Source: Ambulatory Visit | Attending: Family Medicine | Admitting: Family Medicine

## 2012-05-21 DIAGNOSIS — R9409 Abnormal results of other function studies of central nervous system: Secondary | ICD-10-CM | POA: Insufficient documentation

## 2012-05-21 DIAGNOSIS — I639 Cerebral infarction, unspecified: Secondary | ICD-10-CM

## 2012-05-21 DIAGNOSIS — R29810 Facial weakness: Secondary | ICD-10-CM

## 2012-05-21 MED ORDER — SIMVASTATIN 20 MG PO TABS: 40.0000 mg | ORAL_TABLET | Freq: Every day | ORAL | Status: DC

## 2012-05-21 MED ORDER — ASPIRIN EC 325 MG PO TBEC: 325.0000 mg | DELAYED_RELEASE_TABLET | Freq: Every day | ORAL | Status: DC

## 2012-05-21 MED ORDER — GADOBENATE DIMEGLUMINE 529 MG/ML IV SOLN
20.0000 mL | Freq: Once | INTRAVENOUS | Status: AC | PRN
  Administered 2012-05-21: 19 mL via INTRAVENOUS

## 2012-05-21 NOTE — Telephone Encounter (Signed)
Daughter is calling back because her mother said that she is supposed to come back to be seen tomorrow and she needed to know the appt time.  The appt hadn't been scheduled so I scheduled her with Dr. Cristal Ford at 2:30.  The daughter would like Dr. Cristal Ford to call her because she wants to be sure she understands exactly what is going on with her mom.

## 2012-05-21 NOTE — Telephone Encounter (Signed)
Following up MRI result today, most likely represents an actual tiny CVA in pons. Assuming she has no new symptoms and is outside 48 hr window, we can continue with outpatient evaluation. Get 2D echo. Increase statin to 40 mg and increase aspirin from 81 mg to 325 mg. Requested patient call back to discuss. Also needs office visit today or tomorrow.

## 2012-05-21 NOTE — Progress Notes (Addendum)
*  PRELIMINARY RESULTS* Vascular Ultrasound Carotid Duplex (Doppler) has been completed.   There is evidence of elevated right internal carotid artery velocities, suggestive of a 40-59% stenosis, however there is no obvious evidence of plaque morphology to support a significant stenosis >40%. The left internal carotid artery demonstrates elevated velocities suggestive of a stenosis <40%. Vertebral arteries are patent with antegrade flow. Incidental finding: The right thyroid lobe demonstrates a 2cm cystic area with echogenic foci within.  05/21/2012 9:58 AM Gertie Fey, RDMS, RDCS

## 2012-05-21 NOTE — Telephone Encounter (Addendum)
Spoke with daughter and informed her that she can discuss any concerns that she is having with Dr. Cristal Ford at her appt. Informed her that we will make an appt for her to have a 2-d echo and for her and that Dr. Cristal Ford would like for her to Increase statin to 40 mg and increase aspirin from 81 mg to 325 mg. Daughter voiced understanding and agreed.Loralee [place]as [place]

## 2012-05-22 ENCOUNTER — Ambulatory Visit (HOSPITAL_COMMUNITY)
Admission: RE | Admit: 2012-05-22 | Discharge: 2012-05-22 | Disposition: A | Discharge status: Home / Self Care | Payer: Medicare HMO | Source: Ambulatory Visit | Attending: Family Medicine | Admitting: Family Medicine

## 2012-05-22 ENCOUNTER — Ambulatory Visit (INDEPENDENT_AMBULATORY_CARE_PROVIDER_SITE_OTHER): Payer: Medicare HMO | Admitting: Family Medicine

## 2012-05-22 ENCOUNTER — Encounter: Payer: Self-pay | Admitting: Family Medicine

## 2012-05-22 VITALS — BP 124/64 | HR 66 | Temp 98.1°F | Wt 192.0 lb

## 2012-05-22 DIAGNOSIS — I10 Essential (primary) hypertension: Secondary | ICD-10-CM | POA: Insufficient documentation

## 2012-05-22 DIAGNOSIS — E119 Type 2 diabetes mellitus without complications: Secondary | ICD-10-CM

## 2012-05-22 DIAGNOSIS — R9431 Abnormal electrocardiogram [ECG] [EKG]: Secondary | ICD-10-CM | POA: Insufficient documentation

## 2012-05-22 DIAGNOSIS — R0789 Other chest pain: Secondary | ICD-10-CM

## 2012-05-22 DIAGNOSIS — I4510 Unspecified right bundle-branch block: Secondary | ICD-10-CM | POA: Insufficient documentation

## 2012-05-22 DIAGNOSIS — R29810 Facial weakness: Secondary | ICD-10-CM | POA: Insufficient documentation

## 2012-05-22 DIAGNOSIS — R071 Chest pain on breathing: Secondary | ICD-10-CM

## 2012-05-22 NOTE — Assessment & Plan Note (Signed)
At goal.  

## 2012-05-22 NOTE — Progress Notes (Signed)
  Subjective:    Patient ID: Wendy Yang, female    DOB: Dec 31, 1925, 79 y.o.   MRN: [id number]  HPI presents with Daughter Wendy Yang today.   1. Follow up facial swelling/?droop. Patient endorses feeling normal and no problems with her face. She relates her current problem to feeling of lip swelling that is on and off for months, although she had complained of left facial droop/swelling 2 days ago. This was concerning for subacute stroke, so MRI obtained showing possibility of tiny 1 mm pontine CVA (though radiology comments uncertainty). Carotid dopplers nonobstructive.   She lives alone in [place], her daughters live nearby.   Nonsmoker.  Review of Systems Patient denies any weakness in arms/legs/face, numbness, balance issues, gait problem, swallowing problems, speech difficulty, vision deficits, double vision, rash, headaches, confusion, palplitations, chest pain, dyspnea.     Objective:   Physical Exam  Vitals reviewed. Constitutional: She is oriented to person, place, and time. She appears well-developed and well-nourished. No distress.  HENT:  Head: Normocephalic and atraumatic.  Right Ear: External ear normal.  Left Ear: External ear normal.  Nose: Nose normal.  Mouth/Throat: Oropharynx is clear and moist. No oropharyngeal exudate.  Eyes: EOM are normal. Pupils are equal, round, and reactive to light.  Neck: Neck supple.  Cardiovascular: Normal rate, regular rhythm and normal heart sounds.   No murmur heard. Pulmonary/Chest: Effort normal and breath sounds normal. No respiratory distress. She has no wheezes. She has no rales.  Musculoskeletal: She exhibits no edema and no tenderness.  Neurological: She is alert and oriented to person, place, and time. No cranial nerve deficit. She exhibits normal muscle tone. Coordination normal.       CN II-XII intact.  5/5 UE strength and LE strength.  Finger-nose and rapid alt movements normal. Speech articulate. No facial droop or  asymmetry noted.    Skin: No rash noted. She is not diaphoretic.  Psychiatric: She has a normal mood and affect.    EKG: there are no previous tracings available for comparison, normal sinus rhythm rate 63, RBBB, 1st degree AV block.      Assessment & Plan:

## 2012-05-22 NOTE — Patient Instructions (Addendum)
You may have had a very tiny stroke according to radiologist. You have no symptoms of stroke right now.  Lets check echocardiogram. Make appointment with Dr. Mauricio Po in 1-2 weeks for follow up.  IF you have any weakness, numbness, trouble speaking or swallowing then call 911.   Stroke Prevention Some medical conditions and behaviors are associated with an increased chance of having a stroke. You may prevent a stroke by making healthy choices and managing medical conditions. Reduce your risk of having a stroke by:  Staying physically active. Get at least 30 minutes of activity on most or all days.  Not smoking. It may also be helpful to avoid exposure to secondhand smoke.  Limiting alcohol use. Moderate alcohol use is considered to be:  No more than 2 drinks per day for men.  No more than 1 drink per day for nonpregnant women.  Eating healthy foods.  Include 5 or more servings of fruits and vegetables a day.  Certain diets may be prescribed to address high blood pressure, high cholesterol, diabetes, or obesity.  Managing your cholesterol levels.  A low-saturated fat, low-trans fat, low-cholesterol, and high-fiber diet may control cholesterol levels.  Take any prescribed medicines to control cholesterol as directed by your caregiver.  Managing your diabetes.  A controlled-carbohydrate, controlled-sugar diet is recommended to manage diabetes.  Take any prescribed medicines to control diabetes as directed by your caregiver.  Controlling your high blood pressure (hypertension).  A low-salt (sodium), low-saturated fat, low-trans fat, and low-cholesterol diet is recommended to manage high blood pressure.  Take any prescribed medicines to control hypertension as directed by your caregiver.  Maintaining a healthy weight.  A reduced-calorie, low-sodium, low-saturated fat, low-trans fat, low-cholesterol diet is recommended to manage weight.  Stopping drug abuse.  Avoiding birth  control pills.  Talk to your caregiver about the risks of taking birth control pills if you are over 30 years old, smoke, get migraines, or have ever had a blood clot.  Getting evaluated for sleep disorders (sleep apnea).  Talk to your caregiver about getting a sleep evaluation if you snore a lot or have excessive sleepiness.  Taking medicines as directed by your caregiver.  For some people, aspirin or blood thinners (anticoagulants) are helpful in reducing the risk of forming abnormal blood clots that can lead to stroke. If you have the irregular heart rhythm of atrial fibrillation, you should be on a blood thinner unless there is a good reason you cannot take them.  Understand all your medicine instructions. SEEK IMMEDIATE MEDICAL CARE IF:   You have sudden weakness or numbness of the face, arm, or leg, especially on one side of the body.  You have sudden confusion.  You have trouble speaking (aphasia) or understanding.  You have sudden trouble seeing in one or both eyes.  You have sudden trouble walking.  You have dizziness.  You have a loss of balance or coordination.  You have a sudden, severe headache with no known cause.  You have new chest pain or an irregular heartbeat. Any of these symptoms may represent a serious problem that is an emergency. Do not wait to see if the symptoms will go away. Get medical help right away. Call your local emergency services (911 in [place] Do not drive yourself to the hospital. Document Released: 05/31/2004 Document Revised: 07/16/2011 Document Reviewed: 12/11/2010 [facility] Patient Information 2013 [place], [place].

## 2012-05-22 NOTE — Assessment & Plan Note (Signed)
Resolved symptoms. This may correlate to a tiny defect on MRI indicating acute infarct, but radiology read not conclusive. Other possibilities include TIA vs bells palsy vs her chronic lip swelling causing some asymmetry.  Discussed with patient the possibility of being a stroke, and implications of risk of future stroke. Will be prudent and finish work up to include 2D echo. Carotid doppler non-obstructive LDL 103. Borderline a1c indicates impaired fasting glucose, no indication for medication currently. TSH wnl. EKG does not show arrythmia, no symptoms to suggest intermittent arrhythmia either. Will not obtain ambulatory monitoring at this time. Continue medical management: ASA, ACEi/HCTZ, statin.  She and her daughter understand need to monitor for future symptoms discussed in detail. She agrees to call 911 emergently if stroke symptoms develop. We discussed utility of neurologic referral, but decide to forgo this pending any abnormality on echocardiogram.  F/u with PCP in 2 weeks. Case d/w Dr. Earnest Bailey today.

## 2012-06-03 ENCOUNTER — Telehealth: Payer: Self-pay | Admitting: Family Medicine

## 2012-06-03 ENCOUNTER — Ambulatory Visit (HOSPITAL_COMMUNITY)
Admission: RE | Admit: 2012-06-03 | Discharge: 2012-06-03 | Disposition: A | Discharge status: Home / Self Care | Payer: Medicare HMO | Source: Ambulatory Visit | Attending: Family Medicine | Admitting: Family Medicine

## 2012-06-03 DIAGNOSIS — I080 Rheumatic disorders of both mitral and aortic valves: Secondary | ICD-10-CM | POA: Insufficient documentation

## 2012-06-03 DIAGNOSIS — I6789 Other cerebrovascular disease: Secondary | ICD-10-CM | POA: Insufficient documentation

## 2012-06-03 DIAGNOSIS — I639 Cerebral infarction, unspecified: Secondary | ICD-10-CM

## 2012-06-03 DIAGNOSIS — I079 Rheumatic tricuspid valve disease, unspecified: Secondary | ICD-10-CM | POA: Insufficient documentation

## 2012-06-03 NOTE — Telephone Encounter (Signed)
Echo results discussed. No etiology for CVA/TIA. She has no neurologic symptoms or recurrences of facial droop or swelling. I advised her to make appt with PCP in 1-2 weeks for follow up and she agrees.

## 2012-06-13 ENCOUNTER — Encounter: Payer: Self-pay | Admitting: Family Medicine

## 2012-06-13 ENCOUNTER — Ambulatory Visit (INDEPENDENT_AMBULATORY_CARE_PROVIDER_SITE_OTHER): Payer: Medicare HMO | Admitting: Family Medicine

## 2012-06-13 VITALS — BP 147/68 | HR 60 | Temp 97.8°F | Ht 63.5 in | Wt 194.0 lb

## 2012-06-13 DIAGNOSIS — E119 Type 2 diabetes mellitus without complications: Secondary | ICD-10-CM

## 2012-06-13 DIAGNOSIS — R29810 Facial weakness: Secondary | ICD-10-CM

## 2012-06-13 DIAGNOSIS — J3489 Other specified disorders of nose and nasal sinuses: Secondary | ICD-10-CM

## 2012-06-13 DIAGNOSIS — E7800 Pure hypercholesterolemia, unspecified: Secondary | ICD-10-CM

## 2012-06-13 MED ORDER — SIMVASTATIN 40 MG PO TABS: 40.0000 mg | ORAL_TABLET | Freq: Every day | ORAL | Status: DC

## 2012-06-13 MED ORDER — FLUTICASONE PROPIONATE 50 MCG/ACT NA SUSP: 2.0000 | Freq: Every day | NASAL | Status: DC

## 2012-06-13 NOTE — Assessment & Plan Note (Signed)
Based on her description I am inclined to think this is not related to true stroke, but rather is an atopic reaction based on her description.  The MRI brain reading leaves considerable doubt as to the radiographic evidence for stroke.  There are no sources of embolism identified on workup with ECHO and carotid dopplers.  Nonetheless, we have talked about primary prevention of CVA as consisting of many of the same measures we would take in her anyway, given her PMHx of DM, HTN, and an LDL greater than 100.  Namely, we will continue to control blood pressure, continue anti-platelet therapy with ASA, and will increase her statin dose to simvastatin 40mg  nightly (had been taking 20mg  nightly prior to this visit).  Discussed s/sx of true stroke.  Will follow up in 3 months and recheck BP and A1C at that time.

## 2012-06-13 NOTE — Patient Instructions (Addendum)
It was a pleasure to see you today.  I agree with the idea of continuing treatments that will help to prevent a stroke (aspirin, the simvastatin, and controlling your blood pressure and sugar).   I am increasing the simvastatin to 40mg  one tablet at bedtime, a 90-day prescription was sent to [facility] pharmacy [facility].  Flonase nasal spray 2 sprays per nostril in the mornings. Sometimes using nasal saline before applying the Flonase can make it work better. Rinse mouth/gargle after using the Flonase.   I would like to see you back in 3-4 months.

## 2012-06-13 NOTE — Assessment & Plan Note (Signed)
Continues with nasal allergy symptoms, not improved with use of Singulair.  Will give another attempt to nasal steroid sprays.  Does not exhibit signs of bacterial sinusitis. Discussed instructions on proper use, the instructions to use nasal saline spray before the flonase and to rinse/gargle after use.

## 2012-06-13 NOTE — Progress Notes (Signed)
  Subjective:    Patient ID: Wendy Yang, female    DOB: 1925/11/13, 79 y.o.   MRN: [id number]  HPI Here today for follow up, she reports that she has been doing well since recent workup for concern about possible minute pontine stroke.  Neither her ECHO nor carotid doppler studies identified any possible embolic sources for CVA.  As she recounts it, Wendy Yang states her symptoms began primarily as R sided lip swelling and some L sided lip droop without associated dysarthria or motor disturbances.  She has had allergic symptoms and atopy in the past, and she assumes that her symptoms were more likely related to this than the possiblity of a stroke.  We reviewed the recent lab workup, with LDL>100 and HgbA1C of 6.5%.      Review of Systems  She has been doing well, no motor defects.  No cough or aspiration.  She does note that she has had ptosis of both eyes for some time which causes some difficulty with upward gaze.     Objective:   Physical Exam Well appearing, jovial, no apparent distress. Speech fluid and clear.  HEENT Neck supple, no cervical adenopathy. Full active ROM neck.  EOMI, PERRL. Notable ptosis of both eyelids, with visible partial occlusion of pupils with gaze above horizontal.  Rest of cranial nerve exam unremarkable (tested all with exception of smell and visual acuity).  Nasal turbinates blue-hued, boggy and with thin white discharge without blood. No frontal or maxillary sinus tenderness. NEURO sensation in hands and lower extremities full and symmetric bilaterally. Full strength in handgrip, elbow/shoulder extension/flexion bilaterally and symmetrically; Hip flexion symmetric and full bilaterally. Gait unremarkable without assistance.        Assessment & Plan:

## 2012-06-13 NOTE — Assessment & Plan Note (Signed)
To increase dose of simvastatin to 40mg  daily.

## 2012-06-13 NOTE — Assessment & Plan Note (Signed)
Well controlled based on recent HgbA1C.  No changes at this time.

## 2012-08-11 ENCOUNTER — Telehealth: Payer: Self-pay | Admitting: Family Medicine

## 2012-08-11 DIAGNOSIS — E119 Type 2 diabetes mellitus without complications: Secondary | ICD-10-CM

## 2012-08-11 NOTE — Telephone Encounter (Signed)
Was told that we would need to make a referral for a routine eye exam to go to [facility] - they told her to call us.

## 2012-08-12 NOTE — Telephone Encounter (Signed)
Fwd. To PCP for referral. Thanks. Lorenda Hatchet, Renato Battles

## 2012-08-12 NOTE — Telephone Encounter (Signed)
Order for referral and the associated Communication letter were written. JB

## 2012-08-12 NOTE — Telephone Encounter (Signed)
Referral faxed to [facility].  Patient was seen there today.  I also noticed that we are not on Ms. Mclear's medicaid card.  I have left her a message to return my call so I can instruct her she must have [facility] put on her Medicaid card.  Right now it is showing [facility].  Ileana Ladd

## 2012-09-16 ENCOUNTER — Ambulatory Visit (INDEPENDENT_AMBULATORY_CARE_PROVIDER_SITE_OTHER): Payer: Medicare HMO | Admitting: Family Medicine

## 2012-09-16 ENCOUNTER — Encounter: Payer: Self-pay | Admitting: Family Medicine

## 2012-09-16 VITALS — Ht 63.0 in | Wt 197.0 lb

## 2012-09-16 DIAGNOSIS — K219 Gastro-esophageal reflux disease without esophagitis: Secondary | ICD-10-CM

## 2012-09-16 DIAGNOSIS — I10 Essential (primary) hypertension: Secondary | ICD-10-CM

## 2012-09-16 MED ORDER — OMEPRAZOLE 40 MG PO CPDR: 40.0000 mg | DELAYED_RELEASE_CAPSULE | Freq: Every day | ORAL | Status: DC

## 2012-09-16 NOTE — Patient Instructions (Addendum)
It was a pleasure to see you today.   For the swelling in your legs (right worse than left), I believe this is from a condition called venous stasis. I recommend using COMPRESSION HOSE (available at pharmacy without prescription) that comes above the knee.    For the stomach issues, I recommend a trial of OMEPRAZOLE 40mg  ONE TIME DAILY.  Please let me know if this does not help the symptoms.    Follow up in about 6 months or sooner if you need anything.

## 2012-09-17 DIAGNOSIS — K219 Gastro-esophageal reflux disease without esophagitis: Secondary | ICD-10-CM | POA: Insufficient documentation

## 2012-09-17 NOTE — Progress Notes (Signed)
  Subjective:    Patient ID: Wendy Yang, female    DOB: 07-14-1925, 79 y.o.   MRN: [id number]  HPI Here for follow up of HTN.  Overall, Wendy Yang feels well.  No further episodes of the facial swelling or any weakness/dysarthria.    Is taking the ASA 81mg  daily.  She has not been taking the statin that was prescribed (says she felt miserable while taking it).  She is not inclined to want a trial of a different statin medication.   Has complaint of some pain in the xyphoid region of the abdomen; comes BEFORE eating and when she lays down at night.  Not worsened by eating.  Has been told of a hiatal hernia in the past, but is unclear if she has had EGD.  No vomiting.  The pain sometimes radiates up to mid-sternum, but is not described as "chest pain".  No dyspnea or cough.  No fevers or sweats.    Discussed cardiac risk and stroke risk.  At the time of her episode of facial swelling there was concern about a possible TIA or small stroke; MRI imaging makes this diagnosis unlikely based upon the reading of the study.  Wendy Yang is not inclined to want to try another statin at this time.  We discussed the new guidelines (which do not apply to people in her age range anyway).  Will continue with ASA and attempt at blood pressure control, which is adequate today.    Review of Systems See above    Objective:   Physical Exam Well appearing, no apparent distress HEENT Neck supple. No cervical adenopathy.  COR Regular S1S2 PULM Clear bilaterally, no rales or wheezes.  ABD Soft, nontender. Some mild discomfort at level of xyphoid/epigastrium with deep palpation.  No RUQ tenderness. She reports some mid-sternal discomfort after abdominal exam.        Assessment & Plan:

## 2012-09-17 NOTE — Assessment & Plan Note (Signed)
Wendy Yang is not inclined to want to try another statin at this time.  We discussed the new guidelines (which do not apply to people in her age range anyway).  Will continue with ASA and attempt at blood pressure control, which is adequate today.

## 2012-09-17 NOTE — Assessment & Plan Note (Signed)
I suspect her report of xyphoid/epigastric pain is related to GERD (worse when laying down).  Will give trial of PPI>  If not improved/resolved with this, then to consider GI for EGD in this octogenarian with relatively new-onset epigastric pain.

## 2012-09-25 ENCOUNTER — Other Ambulatory Visit: Payer: Self-pay | Admitting: Family Medicine

## 2012-10-24 ENCOUNTER — Other Ambulatory Visit: Payer: Self-pay | Admitting: *Deleted

## 2012-10-27 MED ORDER — LISINOPRIL-HYDROCHLOROTHIAZIDE 20-25 MG PO TABS: ORAL_TABLET | ORAL | Status: DC

## 2012-10-28 ENCOUNTER — Telehealth: Payer: Self-pay | Admitting: Family Medicine

## 2012-10-28 NOTE — Telephone Encounter (Signed)
Daughter called.  Mother needs new prescription for blood pressure medication.  Pharmacy said she needed new prescription. She asked why they did not call in for that and was told they dont call in new prescription.  She took the last pill for blood pressure on Sunday. Also told her she needed new prescription for acid reflux. [facility] on [address] Please advice

## 2013-03-06 ENCOUNTER — Encounter: Payer: Self-pay | Admitting: Family Medicine

## 2013-03-06 ENCOUNTER — Ambulatory Visit (INDEPENDENT_AMBULATORY_CARE_PROVIDER_SITE_OTHER): Payer: Medicare HMO | Admitting: Family Medicine

## 2013-03-06 VITALS — BP 132/76 | HR 72 | Ht 63.0 in | Wt 190.0 lb

## 2013-03-06 DIAGNOSIS — R42 Dizziness and giddiness: Secondary | ICD-10-CM | POA: Insufficient documentation

## 2013-03-06 DIAGNOSIS — K219 Gastro-esophageal reflux disease without esophagitis: Secondary | ICD-10-CM

## 2013-03-06 DIAGNOSIS — M25519 Pain in unspecified shoulder: Secondary | ICD-10-CM

## 2013-03-06 DIAGNOSIS — H539 Unspecified visual disturbance: Secondary | ICD-10-CM

## 2013-03-06 DIAGNOSIS — M25511 Pain in right shoulder: Secondary | ICD-10-CM

## 2013-03-06 DIAGNOSIS — I10 Essential (primary) hypertension: Secondary | ICD-10-CM

## 2013-03-06 DIAGNOSIS — Z23 Encounter for immunization: Secondary | ICD-10-CM

## 2013-03-06 LAB — CBC
HCT: 36.8 % (ref 36.0–46.0)
MCHC: 33.4 g/dL (ref 30.0–36.0)
MCV: 92 fL (ref 78.0–100.0)
Platelets: 225 10*3/uL (ref 150–400)
RDW: 14.1 % (ref 11.5–15.5)

## 2013-03-06 LAB — COMPREHENSIVE METABOLIC PANEL
ALT: 11 U/L (ref 0–35)
AST: 16 U/L (ref 0–37)
Chloride: 101 mEq/L (ref 96–112)
Creat: 1.17 mg/dL — ABNORMAL HIGH (ref 0.50–1.10)
Sodium: 140 mEq/L (ref 135–145)
Total Bilirubin: 0.6 mg/dL (ref 0.3–1.2)
Total Protein: 7.2 g/dL (ref 6.0–8.3)

## 2013-03-06 LAB — POCT H PYLORI SCREEN: H Pylori Screen, POC: NEGATIVE

## 2013-03-06 NOTE — Patient Instructions (Addendum)
It was a pleasure to see you today!  1. For your right shoulder pain, I am ordering an x-ray.  I recommend you to use heat/cold packs, "wall walks" with your fingers. Tylenol is okay, I ask that you not use ibuprofen or other anti-inflammatories.   2. We are checking a lab test for a bacteria called H pylori, associated with ulcer disease.  If positive I will be ordering three medicines for 2 weeks to eradicate it.   3. For dizziness, we are checking labs (thyroid, blood count).  I will contact you with results next week ([phone])  Follow up in about 1 month with Dr Mauricio Po.

## 2013-03-06 NOTE — Assessment & Plan Note (Signed)
Pain in R shoulder that presents with limitations in internal rotation.  Concern for frozen shoulder/impingement syndrome.  For xrays first, if tylenol and heat/cold do not help.  She has been doing weights to strengthen it, I wonder if this might not be partially contributing to the pain.  If xrays unremarkable, then to consider shoulder injection either by me, or else at Sports Medicine. JB

## 2013-03-06 NOTE — Assessment & Plan Note (Signed)
Dizziness that presents more as presyncope, less likely vertigo.  Precedes the use of furosemide, occurs only about 3 times in the past 2 weeks.  For check of CBC, TSH, and metabolic panel.  Could consider RN visit for orthostatic BP check as well.  Will call with results of labs when available.

## 2013-03-06 NOTE — Assessment & Plan Note (Signed)
To continue current regimen for HTN; to check chemistry panel to follow renal function, as she has been using intermittent furosemide and had already been having a mild incremental increase in her creatinine.  On lisinopril and HCTZ for now.

## 2013-03-06 NOTE — Assessment & Plan Note (Signed)
Reports at the conclusion of our visit that she has had some difficulty with headaches and vision changes that are better when she removes her spectacles.  Will check Snellen vision test at the conclusion of this visit; she has an appointment with her optometrist Dr [place], but for insurance reasons must have a referral from her primary physician for this service.  Referral completed. JB

## 2013-03-06 NOTE — Progress Notes (Signed)
Subjective:     Patient ID: Wendy Yang, female   DOB: June 01, 1925, 79 y.o.   MRN: [id number] Attending Physician Note; Patient seen and examined by me in conjunction with medical student Wendy Yang, [facility] MS3.  I have reviewed and agree with her documentation, with additions in bold type. JB HPI Wendy Yang presents today for follow-up of HTN and GERD.  She also complains of R shoulder/neck pain and intermittent dizziness. Wendy Yang' primary reason for visit is R shoulder pain that had its onset about one month ago when she accidentally applied a 'stun-gun' to her R hand, which sent a wave of shock-like pain up R arm and into R shoulder/neck.  She has distant history of R rotator cuff surgery 15+ years ago, had been well until this incident at the end of Sept.  She did not fall, no trauma.  Continues with pain.  She has a history of cervical osteoarthritis and has evidence of same on xray c-spine (reviewed).  Is not dropping things from R arm; no focal motor weakness.  She denies decreased sensation in the R UE. JB Hypertension: Patient reports that she has taken furosemide for the last 3 days d/t R ankle swelling which has now subsided. Denies chest pain, SOB, changes in urinary frequency. Has had recent headaches that improve with removal of her glasses. She is scheduled to see her eye doctor next week. Endorses dizziness, as below. Reports that she has bilateral lower extremity edema, and she has taken intermittent doses of furosemide (dose not clear) for up to three days at a time.  Recently took, but is not taking now. JB GERD: Initially improved with omeprazole but patient stopped taking medication regularly several weeks ago and was using it as needed. Symptoms returned and are described as daily chest discomfort, always when lying down. Patient recently started taking omeprazole again and has noted some improvement. She has never been tested for H.pylori to her knowledge. Has intermittent  bouts of epigastric reflux pain, which is relieved when she resumes daily administration of omeprazole.  Recently started taking again, no longer with GERD symptoms. Never tested for H pylori that she is aware of; no record of such testing in her EMR record. JB Shoulder/neck pain: Wendy Yang had R shoulder surgery for a reported rotator cuff injury about 15 years ago. She has been asymptomatic since then until she accidentally shocked her right hand with a stun gun she carries for safety reasons. Since that incident at the end of September she has had pain in her neck and right shoulder. She took ibuprofen one time 2 weeks ago which improved the pain. She also has osteoarthritis of L shoulder and experiences chronic pain and decreased ROM on L side.  Dizziness: 3 episodes of dizziness in last 2 weeks, lasting about 30 seconds each time. Began before patient took Lasix this week. Described as feeling like she is about to pass out. Episodes occurred when patient had been standing/walking for awhile (not when initiating standing). Resolves after 2-3 deep breaths. No palpitation, chest pain, tinnitus, ear pain,  fever, sweats. Patient has felt like she is off-balance in the last 3-4 weeks but denies other motor and sensory deficits.  She reports that she has had three separate episodes of transient pre-syncope ("feels like I'm going to lose consciousness").  Always occurs in the standing position, never with abrupt change in position (has happened after she is standing for some time; lasts for 30 seconds, then goes away.  Never has lost consciousness, no falls.  No chest palpitations or pain; no shortness of breath.  No acute onset of motor weakness or dysarthria.  She had an episode of facial swelling and dysarthria several months ago that raised concern for possible TIA, with negative brain MRI.  Had ECHO done as part of that workup in the beginning of the year, no significant valvular disease, had some mild  concentric LVH and an EF around 55%.  No recent URI symptoms, no fevers/chills, no ear pain or tinnitus. JB  Review of Systems As above    Objective:   Physical Exam Blood pressure measured during encounter: 132/76 Reviewed ECHO 06/03/12 showing mild concentric LVH, LEF 50-55%, no stenotic valvular disease Reviewed right shoulder imaging 2006 showing mild degenerative changes, no acute bony abnormality. Cervical spine- multilevel degenerative disc disease. Exam; Pleasant, well-appearing, no apparent distress.  HEENT Neck supple, TMs clear bilaterally. No frontal or maxillary sinus tenderness. Clear oropharynx.  COR Regular S1S2 No murmurs.  PULM Clear bilaterally, no rales or wheezes.  LEs; No observable pedal edema.  Palpable dp pulses bilaterally. MSK: Spurlings testing negative bilaterally; R shoulder with limitation in internal rotation (Neer/Hawkins testing); handgrip full and symmetric;  Ulnar deviation of fingers on both hands. JB General: well-appearing, pleasant elderly female in NAD HEENT: Moist mucus membranes. Tympanic membrane normal bilaterally. Cardiac: Regular rate and rhythm, no murmurs. Radial and pedal pulses intact. Pulm: CTAB, no wheezes or crackles Abdominal: Soft, non-tender Upper extremities:  R shoulder- tender to palpation along supraspinatus to shoulder joint. Decreased AROM with abduction, external rotation, forward flexion. R internal rotation decreased, positive impingement sign, positive empty can test.  Negative Spurling's.     Assessment:   HTN: well controlled GERD: ongoing, despite current use of Prilosec, consider H.pylori R shoulder pain: may be d/t previously noted cervical radiculopathy vs new injury related to stun gun accident Dizziness: Unclear etiology. Unlikely to be d/t Lasix as dizziness started before patient started taking Lasix. Differential includes thyroid disease, anemia    Plan:  1. HTN -Continue to monitor -Check Cr today  -F/u  in 1 month  2. GERD -H.pylori testing -Continue Prilosec  3. R Shoulder pain -Recommend heat/cold and tylenol use -Obtain x-ray -Consider referral to ortho  4. Dizziness -CBC, TSH -F/u in 1 month

## 2013-03-11 ENCOUNTER — Ambulatory Visit
Admission: RE | Admit: 2013-03-11 | Discharge: 2013-03-11 | Disposition: A | Discharge status: Home / Self Care | Payer: Medicare HMO | Source: Ambulatory Visit | Attending: Family Medicine | Admitting: Family Medicine

## 2013-03-11 DIAGNOSIS — M25511 Pain in right shoulder: Secondary | ICD-10-CM

## 2013-03-16 ENCOUNTER — Telehealth: Payer: Self-pay | Admitting: Family Medicine

## 2013-03-16 DIAGNOSIS — R7989 Other specified abnormal findings of blood chemistry: Secondary | ICD-10-CM | POA: Insufficient documentation

## 2013-03-16 NOTE — Telephone Encounter (Signed)
I called patient to report result of shoulder x-ray showing degenerative changes.  She continues with pain.  To try applying heat and gentle ROM exercises, if not better to call for follow up with me or with [employer]  She agrees.  Also discussed her recent labwork, including a very minimally elevated fasting glucose (103), and a minimally elevated TSH.  Plan to recheck TSH along with free T4; this was checked initially to work up her presyncope/dizziness.  I will contact her with results of the recheck. Paula Compton, MD

## 2013-03-18 ENCOUNTER — Other Ambulatory Visit: Payer: Medicare HMO

## 2013-03-18 DIAGNOSIS — R7989 Other specified abnormal findings of blood chemistry: Secondary | ICD-10-CM

## 2013-03-18 LAB — T4, FREE: Free T4: 0.91 ng/dL (ref 0.80–1.80)

## 2013-03-18 LAB — TSH: TSH: 3.703 u[IU]/mL (ref 0.350–4.500)

## 2013-03-18 NOTE — Progress Notes (Signed)
Drew TSH and Cristobal Goldmann, MLS

## 2013-03-20 ENCOUNTER — Encounter: Payer: Self-pay | Admitting: Family Medicine

## 2013-04-08 ENCOUNTER — Other Ambulatory Visit: Payer: Self-pay | Admitting: Family Medicine

## 2013-04-09 NOTE — Telephone Encounter (Signed)
Patient calls inquiring if refill had been sent to pharmacy.

## 2013-04-09 NOTE — Telephone Encounter (Signed)
Will fwd refill request to PCP.  Lamaya Hyneman, Darlyne Russian, CMA

## 2013-04-09 NOTE — Telephone Encounter (Signed)
Patient request 90 day supply

## 2013-04-13 NOTE — Telephone Encounter (Signed)
Patient requested a 90 day supply.  When refilled on 04/08/13, a 30 day supply was prescribed.  May pt have a 90 day supply?  Yovan Leeman, Darlyne Russian, CMA

## 2013-04-14 MED ORDER — LISINOPRIL-HYDROCHLOROTHIAZIDE 20-25 MG PO TABS: ORAL_TABLET | ORAL | Status: DC

## 2013-04-14 NOTE — Addendum Note (Signed)
Addended by: Barbaraann Barthel on: 04/14/2013 08:34 AM   Modules accepted: Orders

## 2013-04-14 NOTE — Telephone Encounter (Signed)
Changed to 90 days. JB

## 2013-06-09 ENCOUNTER — Ambulatory Visit: Payer: Medicare HMO | Admitting: Family Medicine

## 2013-06-16 ENCOUNTER — Ambulatory Visit (INDEPENDENT_AMBULATORY_CARE_PROVIDER_SITE_OTHER): Payer: Commercial Managed Care - HMO | Admitting: Family Medicine

## 2013-06-16 ENCOUNTER — Encounter: Payer: Self-pay | Admitting: Family Medicine

## 2013-06-16 VITALS — BP 115/75 | HR 67 | Temp 98.1°F | Ht 63.0 in | Wt 186.6 lb

## 2013-06-16 DIAGNOSIS — I10 Essential (primary) hypertension: Secondary | ICD-10-CM

## 2013-06-16 DIAGNOSIS — M79642 Pain in left hand: Secondary | ICD-10-CM | POA: Insufficient documentation

## 2013-06-16 DIAGNOSIS — H6120 Impacted cerumen, unspecified ear: Secondary | ICD-10-CM

## 2013-06-16 DIAGNOSIS — E119 Type 2 diabetes mellitus without complications: Secondary | ICD-10-CM

## 2013-06-16 DIAGNOSIS — M79609 Pain in unspecified limb: Secondary | ICD-10-CM

## 2013-06-16 DIAGNOSIS — M25511 Pain in right shoulder: Secondary | ICD-10-CM

## 2013-06-16 DIAGNOSIS — M25519 Pain in unspecified shoulder: Secondary | ICD-10-CM

## 2013-06-16 DIAGNOSIS — M79641 Pain in right hand: Secondary | ICD-10-CM

## 2013-06-16 LAB — POCT GLYCOSYLATED HEMOGLOBIN (HGB A1C): HEMOGLOBIN A1C: 6.3

## 2013-06-16 MED ORDER — LISINOPRIL-HYDROCHLOROTHIAZIDE 20-25 MG PO TABS: ORAL_TABLET | ORAL | Status: DC

## 2013-06-16 MED ORDER — OMEPRAZOLE 40 MG PO CPDR: 40.0000 mg | DELAYED_RELEASE_CAPSULE | Freq: Every day | ORAL | Status: DC

## 2013-06-16 NOTE — Patient Instructions (Addendum)
It was a pleasure to see you today.    For the pain in the right shoulder and the hands, you may take Tylenol 500 to 650mg  tablets, every 6 hours as needed for the pain.  I refilled your blood pressure medicine and prilosec medicine.   Ear lavage of right ear today.  Follow up in 6 months or sooner if needed.  Please follow up if you notice hand swelling, worsening pain, or other changes.

## 2013-06-17 NOTE — Assessment & Plan Note (Signed)
Patient with pain in R shoulder, also with bilateral trapezius tenderness.  Given the bilateral PIP joint stiffness, would consider RF, CRP, anti-CCP if not improving. Her shoulder pain seems to be related to degenerative joint disease, and is relieved by Tylenol.  She does not take Tylenol regularly for fear of becoming "dependent" on it.  Plan for more regular use of Tylenol as long as it gives relief, follow up in 2-3 months or sooner if needed.

## 2013-06-17 NOTE — Assessment & Plan Note (Signed)
Well controlled, continue current management

## 2013-06-17 NOTE — Progress Notes (Signed)
   Subjective:    Patient ID: Wendy Yang, female    DOB: 07-25-1925, 80 y.o.   MRN: [id number]  HPI Patient presents for follow up for hypertension, and bilateral shoulder pain.  Has had chronic pain that radiates up to the base of her neck, worse in wet weather and with changes in weather (such as now).  Worse at bedtime.  Uses an otc cream called "Pain Relief" which helps somewhat. Pain scale now is 5/10.  She has had surgery on her R shoulder 15 years ago.  Had a shot in the shoulder when a patient at Dr Dimas MillinMcKenzie's office, did not help much then.  Reviewed results of xray of R shoulder done 03/06/13, showing degenerative changes.   Regarding HTN, continues on lisinopril/HCTZ and an aspirin daily.  No chest pain, no dyspnea, no cough.  She has a diagnosis of GERD, uses prilosec intermittently (approximately 2 times a week) for this.  Not worsening.      Review of Systems Remarks that she has had intermittent sensation of ear being clogged.  No pain.  No fevers or chills. No otorrhea.      Objective:   Physical Exam Well appearing, no apparent distress HEENT< Full active ROM of neck in flexion/extension, rotation and sidebending.  No point tenderness over cervical spine. No cervical adenopathy. Negative Spurlings bilat. TM on L is clear and with good cone of light.  R TM is obstructed by debris/cerumen.  No tenderness to palpate the pinna/tragus. No periauricular adenopathy.  MSK: Handgrip full and symmetric.  Sensation in both hands grossly intact and symmetric. Tenderness over the trapezius mm bilaterally with palpation.  COR Regular S1S2 PULM Clear bilaterally, no rales or wheezes.        Assessment & Plan:

## 2013-06-17 NOTE — Assessment & Plan Note (Signed)
Cerumen impactin, for ear lavage today.

## 2013-10-02 ENCOUNTER — Ambulatory Visit
Admission: RE | Admit: 2013-10-02 | Discharge: 2013-10-02 | Disposition: A | Discharge status: Home / Self Care | Payer: Commercial Managed Care - HMO | Source: Ambulatory Visit | Attending: Family Medicine | Admitting: Family Medicine

## 2013-10-02 ENCOUNTER — Encounter: Payer: Self-pay | Admitting: Family Medicine

## 2013-10-02 ENCOUNTER — Ambulatory Visit (INDEPENDENT_AMBULATORY_CARE_PROVIDER_SITE_OTHER): Payer: Medicare HMO | Admitting: Family Medicine

## 2013-10-02 VITALS — BP 119/74 | HR 60 | Ht 63.0 in | Wt 187.0 lb

## 2013-10-02 DIAGNOSIS — M79642 Pain in left hand: Principal | ICD-10-CM

## 2013-10-02 DIAGNOSIS — M25511 Pain in right shoulder: Secondary | ICD-10-CM

## 2013-10-02 DIAGNOSIS — Z91038 Other insect allergy status: Secondary | ICD-10-CM

## 2013-10-02 DIAGNOSIS — M79641 Pain in right hand: Secondary | ICD-10-CM

## 2013-10-02 DIAGNOSIS — M79609 Pain in unspecified limb: Secondary | ICD-10-CM

## 2013-10-02 DIAGNOSIS — M25519 Pain in unspecified shoulder: Secondary | ICD-10-CM

## 2013-10-02 DIAGNOSIS — Z91030 Bee allergy status: Secondary | ICD-10-CM

## 2013-10-02 DIAGNOSIS — E119 Type 2 diabetes mellitus without complications: Secondary | ICD-10-CM

## 2013-10-02 DIAGNOSIS — R42 Dizziness and giddiness: Secondary | ICD-10-CM

## 2013-10-02 LAB — POCT GLYCOSYLATED HEMOGLOBIN (HGB A1C): HEMOGLOBIN A1C: 6.2

## 2013-10-02 LAB — POCT SEDIMENTATION RATE: POCT SED RATE: 21 mm/hr (ref 0–22)

## 2013-10-02 LAB — RHEUMATOID FACTOR

## 2013-10-02 LAB — CBC
HCT: 34.1 % — ABNORMAL LOW (ref 36.0–46.0)
Hemoglobin: 11.3 g/dL — ABNORMAL LOW (ref 12.0–15.0)
MCH: 29.8 pg (ref 26.0–34.0)
MCHC: 33.1 g/dL (ref 30.0–36.0)
MCV: 90 fL (ref 78.0–100.0)
Platelets: 216 10*3/uL (ref 150–400)
RBC: 3.79 MIL/uL — ABNORMAL LOW (ref 3.87–5.11)
RDW: 14.4 % (ref 11.5–15.5)
WBC: 4.1 10*3/uL (ref 4.0–10.5)

## 2013-10-02 LAB — C-REACTIVE PROTEIN

## 2013-10-02 MED ORDER — EPINEPHRINE 0.3 MG/0.3ML IJ SOAJ: 0.3000 mg | Freq: Once | INTRAMUSCULAR | Status: DC

## 2013-10-02 NOTE — Assessment & Plan Note (Signed)
Given finding of ulnar deviation and family hx of rheumatoid arthritis, would be inclined to start workup for Rheumatoid Arthritis.  She has adequate pain relief with daily Tylenol.  Continue with this, while awaiting results of labs and xrays both hands.

## 2013-10-02 NOTE — Progress Notes (Signed)
   Subjective:    Patient ID: Wendy Yang, female    DOB: [birth year], 80 y.o.   MRN: [id number]  HPI Patient here for follow up of pain in R first metacarpal and wrist joints. Has been the same as last visit.  Some bilat shoulder pain, R shoulder prior surgery and cannot sleep on the R side because of shoulder pain. No injury or trauma recently. Prior surgery "rotator cuff surgery".   Notes that on Mother's Day she had onset of acute dyspnea while making corn bread in her kitchen.  Felt short of breath without chest pain; lasted for 5 minutes or so.  She has had occasional spells where she feels 'off-balance', does not fall. No presyncope. No palpitations.  Last time this happened was this past Tuesday (May 26). Came on her while walking down the hallway in her home, lasted for a matter of a few minutes. Describes it as feeling "unsteady" on her feet.  We reviewed the results of MRI brain done in the beginning of 2014 when her presentation raised concern for stroke; minute pixel-sized finding in cerebellum and thought to be artifact.   Prior history of bee stings requiring Epinephrine in the emergency department; carries epi pen and her current one expired in 2012. Would like refill.   Family Hx; Paternal grandmother with "rheumatism".   Review of Systems No fevers or chills, no chest pain, no palpitations, no presyncope or syncope. No decreased sensation in feet.  No spinning sensation. No headache. No focal weakness or loss of strength. No vision changes.      Objective:   Physical Exam Well appearing, no apparent distress HEENT Neck supple, no cervical adenopathy. TMs clear bilaterally. No frontal or maxillary tenderness. Clear oropharynx. EOMI. PERRL. COR regular S1S2, no extra sounds, no murmurs. PULM Clear bilaterally, no rales or wheezes bilaterally.  NEURO: Gait unremarkable without assistance. Handgrip symmetric and 5/5 bilaterally. Wrist flexion/extension and UE/LE strength full  and symmetric bilaterally. Monofilament testing in feet with grossly intact sensation in both feet. Palpable dp and radial pulses bilaterally.  MSK: Trace ulnar deviation in both hands. No rubor/tumor in R first/second metacarpals. No skin changes.   Full active ROM both shoulders; marked tenderness with internal rotation and resistance on the RIGHT shoulder (Neer and Hawkins).        Assessment & Plan:

## 2013-10-02 NOTE — Patient Instructions (Signed)
[phone]  It was a pleasure to see you today.   For the hand pain, I am ordering xrays of both hands, as well as blood tests to look for causes of arthritis (such as rheumatoid arthritis).   R shoulder x-ray as well; if the x-ray is unremarkable, then we may try a steroid injection in the R shoulder at your next visit.

## 2013-10-02 NOTE — Assessment & Plan Note (Signed)
Patient s/p shoulder surgery (Right), now with continued pain in R shoulder. Exam shows she is has significant pain with internal rotation, relatively full active ROM despite pain.  Consider subacromial injection after XRAY of R shoulder first.

## 2013-10-02 NOTE — Assessment & Plan Note (Signed)
Infrequent intermittent "unsteadiness" and feeling "off-balance".  No findings on today's neuro exam; no falls at home. Plan for return for MMSE. Given absence of palpitations, chest pain or presyncope (and in light of her ECHO from 16 months ago, results reviewed today), I do not believe that further evaluation with ECHO or Holter monitor is likely to be fruitful. May consider orthostatic hypotension, to check orthostatics at next visit if continued sx.

## 2013-10-05 ENCOUNTER — Telehealth: Payer: Self-pay | Admitting: Family Medicine

## 2013-10-05 LAB — CYCLIC CITRUL PEPTIDE ANTIBODY, IGG

## 2013-10-05 NOTE — Telephone Encounter (Signed)
Called patient and gave results of labs, which do not support inflammatory arthritis.  Suspect osteoarthritis.  Pain in hands and R shoulder are better since visit last week.  Tylenol as needed is effective for her pain management.  Paula Compton, MD

## 2013-10-05 NOTE — Telephone Encounter (Signed)
Called patient and gave lab results and x-ray results. She says her hands and shoulder are better since visit last week. She will let me know if the R shoulder is acting up and would like to consider a shoulder injection.  Paula Compton, MD

## 2014-02-09 ENCOUNTER — Ambulatory Visit (INDEPENDENT_AMBULATORY_CARE_PROVIDER_SITE_OTHER): Payer: Commercial Managed Care - HMO | Admitting: Family Medicine

## 2014-02-09 ENCOUNTER — Encounter: Payer: Self-pay | Admitting: Family Medicine

## 2014-02-09 VITALS — BP 151/84 | HR 63 | Temp 98.5°F | Ht 63.0 in | Wt 185.0 lb

## 2014-02-09 DIAGNOSIS — I10 Essential (primary) hypertension: Secondary | ICD-10-CM

## 2014-02-09 DIAGNOSIS — E119 Type 2 diabetes mellitus without complications: Secondary | ICD-10-CM

## 2014-02-09 DIAGNOSIS — R600 Localized edema: Secondary | ICD-10-CM

## 2014-02-09 LAB — BASIC METABOLIC PANEL
BUN: 10 mg/dL (ref 6–23)
CO2: 28 mEq/L (ref 19–32)
Calcium: 9.2 mg/dL (ref 8.4–10.5)
Chloride: 107 mEq/L (ref 96–112)
Creat: 1.09 mg/dL (ref 0.50–1.10)
Glucose, Bld: 119 mg/dL — ABNORMAL HIGH (ref 70–99)
POTASSIUM: 4.1 meq/L (ref 3.5–5.3)
SODIUM: 144 meq/L (ref 135–145)

## 2014-02-09 LAB — TSH: TSH: 3.596 u[IU]/mL (ref 0.350–4.500)

## 2014-02-09 LAB — POCT GLYCOSYLATED HEMOGLOBIN (HGB A1C): Hemoglobin A1C: 5.6

## 2014-02-09 MED ORDER — HYDROCHLOROTHIAZIDE 25 MG PO TABS: 25.0000 mg | ORAL_TABLET | Freq: Every day | ORAL | Status: DC

## 2014-02-09 MED ORDER — ASPIRIN EC 81 MG PO TBEC: 81.0000 mg | DELAYED_RELEASE_TABLET | Freq: Every day | ORAL | Status: DC

## 2014-02-09 NOTE — Progress Notes (Signed)
   Subjective:    Patient ID: Wendy BankerMargaret H Taniguchi, female    DOB: 09-Nov-1925, 80 y.o.   MRN: [id number]  HPI Patient here for DM follow up, as well as for complaint of bilateral ankle swelling for the past 1 month.  She reports that she consulted with an herbalist in [place], who recommended she stop her aspirin and lisinopril/HCTZ, and that she start a regimen of 4 different [place] herbs ("Th[place]'s Best", Fugi Gulan-BP herb; Notto Nusi--Glucosamine; Bebeeda Dietica--body cleanser; Asa Nusa--all natural sulfa).  She had been experiencing generalized arthralgias and headaches before starting the herbs and stopping her prescription meds, notes the HAs are gone and her arthralgias are better.  Since starting the herbs 1 month ago, has noticed bilateral ankle edema.  No chest pain or dyspnea, no cough. No fevers or chills.  Has had ease in having bowel movements since starting the cleanser, which is improved from her usual baseline constipation.  No pain with bowel movements.   Check of BP and A1C today, noted improvement in glucose control.  Not taking any prescription medications for this. Maintains physical activity.   Nonsmoker.   Review of Systems     Objective:   Physical Exam Well appearing, no apparent distress.  HEENT neck supple, no thyroid masses or nodularity, no tenderness. No cervical adenopathy COR Regular S1S2, no extra sounds PULM Clear bilaterally, no rales or wheezes ABD Soft, nontender, nondistended.  EXTS: 1-2+ bilateral pitting edema in feet to ankles; palpable dp pulses bilaterally. No skin lesions or skin maceration. Sensation intact in both feet to light touch.        Assessment & Plan:

## 2014-02-09 NOTE — Assessment & Plan Note (Signed)
Unclear if related to stopping the HCTZ, or SE of the herbal supplements. No findings/hx to suggest renal or CHF causes.  To evaluate BMet today, as well as TSH (has had elevated TSH in the past, was normal 11 months ago).  To restart HCTZ and baby aspirin, recheck in 1 month. I have admitted my lack of knowledge on the effects of her herbs, but I have informed her that it may be that the swelling is related to the herbal treatments she has been using.

## 2014-02-09 NOTE — Patient Instructions (Signed)
It was a pleasure to see you today.  I am impressed with the improvement in your blood sugar today! (The hemoglobin A1C is down to 5.6%, this is excellent!)  As we discussed, I am checking your thyroid and kidney function today, in relation to your ankle swelling.   I am starting you back on the Hydrochlorothiazide 25mg , take 1 tablet one time daily.   I recommend restarting the baby aspirin 81mg  one time daily, for heart health and stroke prevention.   Consider holding the [place] herbs for a period to see if improvement in the ankle swelling.   I would like to see you back in 1 month to reassess your ankle swelling.

## 2014-02-10 ENCOUNTER — Telehealth: Payer: Self-pay | Admitting: Family Medicine

## 2014-02-10 NOTE — Telephone Encounter (Signed)
Called patient to report results.  Plan to hold her [place] supplements, restart the HCTZ as we dicussed yesterday, and recheck in 2-3 weeks.  If she develops chest pressure/pain, or shortness of breath or worsening edema, she is to call sooner.  JB

## 2014-04-20 ENCOUNTER — Ambulatory Visit: Payer: Medicare HMO | Admitting: Family Medicine

## 2014-05-11 ENCOUNTER — Encounter: Payer: Self-pay | Admitting: Family Medicine

## 2014-05-11 ENCOUNTER — Ambulatory Visit (INDEPENDENT_AMBULATORY_CARE_PROVIDER_SITE_OTHER): Payer: Commercial Managed Care - HMO | Admitting: Family Medicine

## 2014-05-11 VITALS — BP 208/107 | HR 71 | Temp 98.1°F | Ht 63.0 in | Wt 178.0 lb

## 2014-05-11 DIAGNOSIS — J069 Acute upper respiratory infection, unspecified: Secondary | ICD-10-CM

## 2014-05-11 DIAGNOSIS — B9789 Other viral agents as the cause of diseases classified elsewhere: Secondary | ICD-10-CM

## 2014-05-11 DIAGNOSIS — I10 Essential (primary) hypertension: Secondary | ICD-10-CM

## 2014-05-11 NOTE — Progress Notes (Signed)
   Subjective:    Patient ID: Wendy BankerMargaret H Wachsmuth, female    DOB: Sep 07, 1925, 81 y.o.   MRN: [id number]  Patient presents for a same day appointment.  HPI  URI / SORE THROAT / COUGH: - Reports symptoms started about 1 week ago with Wednesday AM with sore throat, progressed to bilateral ear pain (L>R), nasal congestion, developed cough later with mild productive cough worse at night. Currently seems to be improved since onset - Tried OTC Tylenol x 1 dose, otherwise no further OTC meds. Reports continuing to take "[place]'s Best" Herbal medicine for BP, glucosamine - No known sick contacts at home - Did not get flu shot this year - Admits bilateral eye discharge in AM ("matted eyes"), previously body soreness (resolved) - Denies fevers/shaking chills, nausea / vomiting, diarrhea, myalgias  CHRONIC HTN: Reports - no concerns recently, admits often BP elevated at clinic improved on re-check, no longer taking medications including HCTZ 25mg  daily, switched to Herbal anti-HTN medication Current Meds - none   Reports good compliance, didnt take herbal meds today (does not use when going out due to diuretic effect). Denies CP, dyspnea, HA, edema, dizziness / lightheadedness  I have reviewed and updated the following as appropriate: allergies and current medications  Social Hx:  - Never smoker  Review of Systems  See above HPI    Objective:   Physical Exam  BP 208/107 mmHg  Pulse 71  Temp(Src) 98.1 F (36.7 C) (Oral)  Ht 5\' 3"  (1.6 m)  Wt 178 lb (80.74 kg)  BMI 31.54 kg/m2   Re-check manual BP: 145/90 (Left arm, at end of visit)  Gen - well-appearing 81 yr elderly F, pleasant, NAD HEENT - NCAT, b/l sinuses non-tender, PERRL, EOMI, b/l TM's clear w/o erythema or bulging, patent nares w/o congestion, oropharynx clear (no erythema, exudates, or asymmetry), MMM Neck - supple, non-tender, no LAD Heart - RRR, no murmurs heard Lungs - CTAB, no wheezing, crackles, or rhonchi. Normal work  of breathing. Skin - warm, dry    Assessment & Plan:   See specific A&P problem list for details.

## 2014-05-11 NOTE — Assessment & Plan Note (Signed)
Consistent with viral URI with cough, currently improving - Exam reassuring, no evidence of localized infection  Plan: 1. Continue symptomatic management at home 2. RTC 1-2 weeks for BP re-check, if worsening f/u at that point

## 2014-05-11 NOTE — Assessment & Plan Note (Signed)
Elevated BP 200s/100s, re-check manual significantly improved 145/90 - Asymptomatic, no complications - No longer on HCTZ, now takes Herbal anti-HTN medication (which did not take today, [facility])  Plan: 1. Advise to take normal Herbal anti-HTN medication at home today 2. Schedule close f/u RTC 1-2 weeks with Dr. Mauricio PoBreen to re-check BP and re-evaluate, red flag symptoms given for HTN

## 2014-05-11 NOTE — Patient Instructions (Signed)
Dear Claris CheMargaret Nienaber, Thank you for coming in to clinic today.  1. I think that you have an Upper Respiratory Virus - I do not see any evidence of ear infection or throat infection. This is reassuring, I do not think you have strep throat. This should continue to run its course (need to avoid re-infection, wash hands well and avoid sick contacts), it may take up to 10 to 14 days to resolve. 2. Recommend if you want to try other Over the counter medications - may try Mucinex twice daily for 1 week (thin your secretions), continue with Tylenol as needed. 3. For your sore throat - warm tea, honey, lozenges will hel  Some important numbers from today's visit: BP - Elevated 200/100, repeat check improved Results -  Please schedule a follow-up appointment with Dr. Mauricio PoBreen in 1-2 weeks to follow-up Blood Pressure re-check (you should be better by this point, make sure you continue to take your Herbal BP Medication)  If you have any other questions or concerns, please feel free to call the clinic to contact me. You may also schedule an earlier appointment if necessary.  However, if your symptoms get significantly worse (or you develop severe Headache, chest pain, shortness of breath, loss of vision) , please go to the Emergency Department to seek immediate medical attention.  Saralyn PilarAlexander Carla Rashad, DO [facility]

## 2014-06-14 NOTE — Progress Notes (Signed)
Reviewed resident assessment and plan.  JB 

## 2014-07-29 ENCOUNTER — Telehealth: Payer: Self-pay | Admitting: Family Medicine

## 2014-07-29 NOTE — Telephone Encounter (Signed)
Referral entered in for [facility] ID #[id number], status is currently suspended.

## 2014-07-29 NOTE — Telephone Encounter (Signed)
Need [employer] referral for patient's visit to the office today.  She is seeing Dr. Chalmers Guestoy Whitaker.  Patient has been a pat there for sometime, but ins coverage changed and didn't know she needed to get a referral.  You can reach Robin at [phone].  Fax referral to [phone].  NPI for them is [id number] and the dx code is H40.11x2.

## 2014-08-13 ENCOUNTER — Telehealth: Payer: Self-pay | Admitting: Student

## 2014-08-13 NOTE — Telephone Encounter (Signed)
[employer] nurse: Needs printout of medications from last year Fax: [phone]

## 2014-08-13 NOTE — Telephone Encounter (Signed)
Spoke with [name] from [employer] and an office note from 2015 with medications listed.  Faxed to number listed below. [name],CMA

## 2014-10-15 ENCOUNTER — Encounter (HOSPITAL_COMMUNITY): Payer: Self-pay | Admitting: Emergency Medicine

## 2014-10-15 ENCOUNTER — Ambulatory Visit: Payer: Medicare HMO | Admitting: Family Medicine

## 2014-10-15 ENCOUNTER — Emergency Department (HOSPITAL_COMMUNITY)
Admission: EM | Admit: 2014-10-15 | Discharge: 2014-10-15 | Disposition: A | Discharge status: Home / Self Care | Payer: Medicare Other | Attending: Emergency Medicine | Admitting: Emergency Medicine

## 2014-10-15 DIAGNOSIS — M5481 Occipital neuralgia: Secondary | ICD-10-CM | POA: Diagnosis not present

## 2014-10-15 DIAGNOSIS — I10 Essential (primary) hypertension: Secondary | ICD-10-CM | POA: Diagnosis not present

## 2014-10-15 DIAGNOSIS — Z79899 Other long term (current) drug therapy: Secondary | ICD-10-CM | POA: Diagnosis not present

## 2014-10-15 DIAGNOSIS — Z7982 Long term (current) use of aspirin: Secondary | ICD-10-CM | POA: Insufficient documentation

## 2014-10-15 DIAGNOSIS — R51 Headache: Secondary | ICD-10-CM | POA: Diagnosis present

## 2014-10-15 DIAGNOSIS — E119 Type 2 diabetes mellitus without complications: Secondary | ICD-10-CM | POA: Insufficient documentation

## 2014-10-15 HISTORY — DX: Type 2 diabetes mellitus without complications: E11.9

## 2014-10-15 HISTORY — DX: Essential (primary) hypertension: I10

## 2014-10-15 MED ORDER — BUPIVACAINE HCL (PF) 0.5 % IJ SOLN
5.0000 mL | Freq: Once | INTRAMUSCULAR | Status: AC
  Administered 2014-10-15: 5 mL
  Filled 2014-10-15: qty 30

## 2014-10-15 NOTE — ED Notes (Signed)
Per patient, states headache and right neck discomfort since Tuesday-takes "herbs" for BP

## 2014-10-15 NOTE — ED Notes (Signed)
Pt reports headache/soreness type pain in R neck/shoulder area.  She states it goes down into her Lt deltoid.  She is alert and oriented.  Neuro checks negative.  Denies Blurred vision or chest pain, or SOB.  States its been there since Sunday.

## 2014-10-15 NOTE — ED Provider Notes (Signed)
CSN: [id number]     Arrival date & time 10/15/14  1821 History   First MD Initiated Contact with Patient 10/15/14 1929     Chief Complaint  Patient presents with  . Headache     (Consider location/radiation/quality/duration/timing/severity/associated sxs/prior Treatment) HPI   81yF with HA. Gradual onset Tuesday. Persistent since. Pain in R posterior head and neck. Waxes/wanes. Sometimes radiates into R temporal region. No appreciable exacerbating or relieving factors. NO acute visual complaints. No fever or chills. No acute numbness,tingling or loss of strength.   Past Medical History  Diagnosis Date  . Hypertension   . Diabetes mellitus without complication    History reviewed. No pertinent past surgical history. No family history on file. History  Substance Use Topics  . Smoking status: Never Smoker   . Smokeless tobacco: Never Used  . Alcohol Use: No   OB History    No data available     Review of Systems All systems reviewed and negative, other than as noted in HPI.    Allergies  Review of patient's allergies indicates no known allergies.  Home Medications   Prior to Admission medications   Medication Sig Start Date End Date Taking? Authorizing Provider  acetaminophen (TYLENOL) 325 MG tablet Take 325 mg by mouth daily as needed for mild pain or moderate pain.   Yes Historical Provider, MD  OVER THE COUNTER MEDICATION Take 1 Package by mouth daily.   Yes Historical Provider, MD  aspirin EC 81 MG tablet Take 1 tablet (81 mg total) by mouth daily. Patient not taking: Reported on 05/11/2014 02/09/14   Barbaraann Barthel, MD  EPINEPHrine (EPIPEN) 0.3 mg/0.3 mL IJ SOAJ injection Inject 0.3 mLs (0.3 mg total) into the muscle once. Patient not taking: Reported on 05/11/2014 10/02/13   Barbaraann Barthel, MD  hydrochlorothiazide (HYDRODIURIL) 25 MG tablet Take 1 tablet (25 mg total) by mouth daily. Patient not taking: Reported on 05/11/2014 02/09/14   Barbaraann Barthel, MD  omeprazole  (PRILOSEC) 40 MG capsule Take 1 capsule (40 mg total) by mouth daily. Patient not taking: Reported on 05/11/2014 06/16/13   Barbaraann Barthel, MD   BP 179/71 mmHg  Pulse 70  Temp(Src) 98.5 F (36.9 C) (Oral)  Resp 20  SpO2 100% Physical Exam  Constitutional: She appears well-developed and well-nourished. No distress.  HENT:  Head: Normocephalic and atraumatic.    Tenderness in pictures area. No overlying skin changes. No mastoid tenderness.neck supple. No nodes.   Eyes: Conjunctivae are normal. Right eye exhibits no discharge. Left eye exhibits no discharge.  Neck: Neck supple.  Cardiovascular: Normal rate, regular rhythm and normal heart sounds.  Exam reveals no gallop and no friction rub.   No murmur heard. Pulmonary/Chest: Effort normal and breath sounds normal. No respiratory distress.  Abdominal: Soft. She exhibits no distension. There is no tenderness.  Musculoskeletal: She exhibits no edema or tenderness.  Neurological: She is alert.  Skin: Skin is warm and dry.  Psychiatric: She has a normal mood and affect. Her behavior is normal. Thought content normal.  Nursing note and vitals reviewed.   ED Course  NERVE BLOCK Date/Time: 10/15/2014 9:00 PM Performed by: Raeford Razor Authorized by: Raeford Razor Consent: Verbal consent obtained. Risks and benefits: risks, benefits and alternatives were discussed Consent given by: patient Indications: pain relief Body area: head Nerve: greater occipital Laterality: right Patient sedated: no Preparation: Patient was prepped and draped in the usual sterile fashion. Patient position: sitting Needle gauge: 27 G Location  technique: anatomical landmarks Local anesthetic: bupivacaine 0.5% without epinephrine Anesthetic total: 2 ml Outcome: pain improved Patient tolerance: Patient tolerated the procedure well with no immediate complications   (including critical care time) Labs Review Labs Reviewed - No data to display  Imaging  Review No results found.   EKG Interpretation None      MDM   Final diagnoses:  Occipital neuralgia of right side    81 year old female with right occipital headache. Location and description of symptoms are consistent with occipital neuralgia. She is tender over the expected course of the occipital nerve. Nonfocal neurological examination. She is given an occipital nerve block with improvement of her symptoms.     Raeford Razor, MD 10/22/14 1118

## 2014-10-15 NOTE — Discharge Instructions (Signed)
Occipital Neuralgia Occipital neuralgia is a type of headache that causes episodes of very bad pain in the back of your head. Pain from occipital neuralgia may spread (radiate) to other parts of your head. The pain is usually brief and often goes away after you rest and relax. These headaches may be caused by irritation of the nerves that leave your spinal cord high up in your neck, just below the base of your skull (occipital nerves). Your occipital nerves transmit sensations from the back of your head, the top of your head, and the areas behind your ears. CAUSES Occipital neuralgia can occur without any known cause (primary headache syndrome). In other cases, occipital neuralgia is caused by pressure on or irritation of one of the two occipital nerves. Causes of occipital nerve compression or irritation include:  Wear and tear of the vertebrae in the neck (osteoarthritis).  Neck injury.  Disease of the disks that separate the vertebrae.  Tumors.  Gout.  Infections.  Diabetes.  Swollen blood vessels that put pressure on the occipital nerves.  Muscle spasm in the neck. SIGNS AND SYMPTOMS Pain is the main symptom of occipital neuralgia. It usually starts in the back of the head but may also be felt in other areas supplied by the occipital nerves. Pain is usually on one side but may be on both sides. You may have:   Brief episodes of very bad pain that is burning, stabbing, shocking, or shooting.  Pain behind the eye.  Pain triggered by neck movement or hair brushing.  Scalp tenderness.  Aching in the back of the head between episodes of very bad pain. DIAGNOSIS  Your health care provider may diagnose occipital neuralgia based on your symptoms and a physical exam. During the exam, the health care provider may push on areas supplied by the occipital nerves to see if they are painful. Some tests may also be done to help in making the diagnosis. These may include:  Imaging studies of  the upper spinal cord, such as an MRI or CT scan. These may show compression or spinal cord abnormalities.  Nerve block. You will get an injection of numbing medicine (local anesthetic) near the occipital nerve to see if this relieves pain. TREATMENT  Treatment may begin with simple measures, such as:   Rest.  Massage.  Heat.  Over-the-counter pain relievers. If these measures do not work, you may need other treatments, including:  Medicines such as:  Prescription-strength anti-inflammatory medicines.  Muscle relaxants.  Antiseizure medicines.  Antidepressants.  Steroid injection. This involves injections of local anesthetic and strong anti-inflammatory drugs (steroids).  Pulsed radiofrequency. Wires are implanted to deliver electrical impulses that block pain signals from the occipital nerve.  Physical therapy.  Surgery to relieve nerve pressure. HOME CARE INSTRUCTIONS  Take all medicines as directed by your health care provider.  Avoid activities that cause pain.  Rest when you have an attack of pain.  Try gentle massage or a heating pad to relieve pain.  Work with a physical therapist to learn stretching exercises you can do at home.  Try a different pillow or sleeping position.  Practice good posture.  Try to stay active. Get regular exercise that does not cause pain. Ask your health care provider to suggest safe exercises for you.  Keep all follow-up visits as directed by your health care provider. This is important. SEEK MEDICAL CARE IF:  Your medicine is not working.  You have new or worsening symptoms. SEEK IMMEDIATE MEDICAL CARE   IF:  You have very bad head pain that is not going away.  You have a sudden change in vision, balance, or speech. MAKE SURE YOU:  Understand these instructions.  Will watch your condition.  Will get help right away if you are not doing well or get worse. Document Released: 04/17/2001 Document Revised: 09/07/2013  Document Reviewed: 04/15/2013 Exit[employer] Patient Information 2015 [employer]. This information is not intended to replace advice given to you by your health care provider. Make sure you discuss any questions you have with your health care provider.  

## 2014-10-19 ENCOUNTER — Ambulatory Visit (INDEPENDENT_AMBULATORY_CARE_PROVIDER_SITE_OTHER): Payer: Medicare Other | Admitting: Family Medicine

## 2014-10-19 ENCOUNTER — Encounter: Payer: Self-pay | Admitting: Family Medicine

## 2014-10-19 VITALS — BP 157/83 | HR 69 | Temp 97.6°F | Wt 172.0 lb

## 2014-10-19 DIAGNOSIS — R51 Headache: Secondary | ICD-10-CM | POA: Diagnosis not present

## 2014-10-19 DIAGNOSIS — R519 Headache, unspecified: Secondary | ICD-10-CM | POA: Insufficient documentation

## 2014-10-19 DIAGNOSIS — I159 Secondary hypertension, unspecified: Secondary | ICD-10-CM | POA: Diagnosis not present

## 2014-10-19 NOTE — Progress Notes (Signed)
   Subjective:    Patient ID: Wendy Yang, female    DOB: 02-06-1926, 81 y.o.   MRN: [id number]  Seen for Same day visit for   CC: right sided neck pain  Patient reports right-sided posterior ear pain that radiates to right temple and right neck.  Reports pain started Friday.  Pain is described as sharp, shooting; it occurs intermittently without any known exacerbating or alleviating factors.  She was seen in the ED and given occipital nerve block.  She reports pain is currently improved.  Greater than 50%, now 4 out of 10 pain, not currently requiring any medications or treatment.  She denies any headache, fevers, visual symptoms, tinnitus or decreased hearing.  He does endorse rash in the same area of pain that has improved.  Rash initially stretch from posterior hairline of the right neck to right posterior ear; however, now is confined to posterior ear.  She endorses right shoulder pain that she reports is chronic in nature and associated with her previous surgery.   Review of Systems   See HPI for ROS. Objective:  BP 157/83 mmHg  Pulse 69  Temp(Src) 97.6 F (36.4 C) (Oral)  Wt 172 lb (78.019 kg)  General: NAD HEENT: ~ 1.5 superficial ulcerative lesion on right posterior superior ear without signs of secondary infection.  No vesicles present. PERRLA. EOMI. no tenderness in temporal area.  Cardiac: RRR, normal heart sounds, no murmurs. 2+ radial and PT pulses bilaterally Respiratory: CTAB, normal effort Neuro: A&Ox4; CN3-12 intact; visual fields intact Gross Sensory & Motor intact    Assessment & Plan:  See Problem List Documentation

## 2014-10-19 NOTE — Assessment & Plan Note (Signed)
BP elevated today.  Patient reports self discontinuing hydrochlorothiazide in favor of herbal tea.  She also no longer takes aspirin daily - Revised on up with PCP in 1-2 weeks for recheck - Advised restarting aspirin 81 mg daily given evidence of old infarct on CT - Recommended patient restarting hydrochlorothiazide; she declines this until PCP follow-up - Also recommended discussing starting statin with PCP

## 2014-10-19 NOTE — Assessment & Plan Note (Signed)
Right posterior ear pain and rash concerning for shingles.  Patient declines viral culture or treatment at this time as pain/rash has improved significantly.  Advised patient to call/return to clinic if pain/rash began to worsen.  - Advised f/u in 1-2 weeks for reassessment; if ulcerative lesion / pain persist, consider dermatology referral for biopsy to rule out cancer

## 2014-10-19 NOTE — Patient Instructions (Signed)
It was great seeing you today.   1. Please make appointment with Dr. Kennon Rounds to follow-up on ear rash/pain, as well as your blood pressure.  2. Call or return to clinic if your rash.  Her pain becomes worse in the next several days, or if you develop any fevers or chills.    Please bring all your medications to every doctors visit  Sign up for My Chart to have easy access to your labs results, and communication with your Primary care physician.   If you have any questions or concerns before then, please call the clinic at [phone].  Take Care,   Dr Wenda Low

## 2014-10-28 ENCOUNTER — Encounter: Payer: Self-pay | Admitting: Family Medicine

## 2014-10-28 ENCOUNTER — Telehealth: Payer: Self-pay | Admitting: Student

## 2014-10-28 ENCOUNTER — Ambulatory Visit (INDEPENDENT_AMBULATORY_CARE_PROVIDER_SITE_OTHER): Payer: Medicare Other | Admitting: Family Medicine

## 2014-10-28 VITALS — BP 151/75 | HR 71 | Temp 97.9°F | Ht 63.0 in | Wt 170.0 lb

## 2014-10-28 DIAGNOSIS — R51 Headache: Secondary | ICD-10-CM

## 2014-10-28 DIAGNOSIS — E119 Type 2 diabetes mellitus without complications: Secondary | ICD-10-CM

## 2014-10-28 DIAGNOSIS — R519 Headache, unspecified: Secondary | ICD-10-CM

## 2014-10-28 LAB — POCT GLYCOSYLATED HEMOGLOBIN (HGB A1C): Hemoglobin A1C: 5.8

## 2014-10-28 MED ORDER — TRAMADOL HCL 50 MG PO TABS: 50.0000 mg | ORAL_TABLET | Freq: Three times a day (TID) | ORAL | Status: DC | PRN

## 2014-10-28 MED ORDER — VALACYCLOVIR HCL 1 G PO TABS: 1000.0000 mg | ORAL_TABLET | Freq: Three times a day (TID) | ORAL | Status: DC

## 2014-10-28 NOTE — Telephone Encounter (Signed)
Wendy Yang attempted to fill her Rx, valACYclovir (VALTREX), given by Dr. Gayla Doss, however it is not covered by her insurance and it costs $200, and she cannot afford this. She would like to know if there is another option. Thank you, [name], ASA

## 2014-10-28 NOTE — Progress Notes (Signed)
   Subjective:    Patient ID: Wendy Yang, female    DOB: April 27, 1926, 81 y.o.   MRN: [id number]  Seen for Same day visit for   CC: rash and pain  He comes back in today for follow-up of rash behind right ear and associated pain.  She reports the rash has improved drastically since her last visit, however, the pain persists.  Still rates the pain as mild to moderate, currently only occasional Tylenol for treatment.  Pain does not interfere with sleep at all.  Pain continues to be described as sharp. It is located in right occipital scalp and right posterior ear and radiates towards the right temple and right neck.  She also reports some generalized fatigue.  However, she denies fevers, chills.   Review of Systems   See HPI for ROS. Objective:  BP 151/75 mmHg  Pulse 71  Temp(Src) 97.9 F (36.6 C) (Oral)  Ht 5\' 3"  (1.6 m)  Wt 170 lb (77.111 kg)  BMI 30.12 kg/m2  General: NAD Skin: Mild erythema and healing ulcer on superficial, posterior right ear improved from last visit.  No rash appreciated on right posterior scalp, temple, or neck.  Neuro: alert and oriented, no focal deficits  Assessment & Plan:  See Problem List Documentation

## 2014-10-28 NOTE — Telephone Encounter (Signed)
Will forward to Dr. Gayla Doss to see if there is another medication that patient can take. Armilda Vanderlinden,CMA

## 2014-10-28 NOTE — Assessment & Plan Note (Signed)
Again, discussed concerned this was shingles with patient, and she agrees to start valacyclovir.  - Refer to dermatology for further evaluation - Valacyclovir 1000 mg 3 times a day 10 days - Ultram 50 mg every 6 hours when necessary , #30 - Follow-up with PCP in 1-2 months, or sooner if needed

## 2014-10-28 NOTE — Telephone Encounter (Signed)
Called and switched medication to acyclovir 800 mg 5 times a day 7 days.  Called Wendy Yang informed her that her medication was ready at the pharmacy and would cost about $35.

## 2014-10-28 NOTE — Patient Instructions (Signed)
It was great seeing you today.    Take valacyclovir 1000 mg 3 times a day for 10 days.  Be sure to drink plenty of water while taking this medication   Prescribed Ultram 50 mg; take every 6 hours as needed for pain  I referred to to dermatology for further evaluation rash and pain  Please bring all your medications to every doctors visit Sign up for My Chart to have easy access to your labs results, and communication with your Primary care physician.  Next Appointment  Please call to make an appointment with Dr Kennon Rounds in 1-2 months   If you have any questions or concerns before then, please call the clinic at [phone].  Take Care,   Dr Wenda Low

## 2014-11-17 ENCOUNTER — Encounter: Payer: Self-pay | Admitting: *Deleted

## 2014-12-01 ENCOUNTER — Ambulatory Visit (INDEPENDENT_AMBULATORY_CARE_PROVIDER_SITE_OTHER): Payer: Medicare Other | Admitting: Internal Medicine

## 2014-12-01 ENCOUNTER — Encounter: Payer: Self-pay | Admitting: Internal Medicine

## 2014-12-01 VITALS — BP 120/82 | HR 71 | Temp 97.8°F | Resp 20 | Ht 63.0 in | Wt 176.2 lb

## 2014-12-01 DIAGNOSIS — L509 Urticaria, unspecified: Secondary | ICD-10-CM | POA: Diagnosis not present

## 2014-12-01 DIAGNOSIS — B0229 Other postherpetic nervous system involvement: Secondary | ICD-10-CM

## 2014-12-01 DIAGNOSIS — R200 Anesthesia of skin: Secondary | ICD-10-CM

## 2014-12-01 DIAGNOSIS — R7309 Other abnormal glucose: Secondary | ICD-10-CM

## 2014-12-01 DIAGNOSIS — J301 Allergic rhinitis due to pollen: Secondary | ICD-10-CM | POA: Diagnosis not present

## 2014-12-01 DIAGNOSIS — R202 Paresthesia of skin: Secondary | ICD-10-CM

## 2014-12-01 DIAGNOSIS — M542 Cervicalgia: Secondary | ICD-10-CM | POA: Diagnosis not present

## 2014-12-01 DIAGNOSIS — R7303 Prediabetes: Secondary | ICD-10-CM

## 2014-12-01 MED ORDER — PREDNISONE 10 MG PO TABS: ORAL_TABLET | ORAL | Status: DC

## 2014-12-01 MED ORDER — GABAPENTIN 100 MG PO CAPS: ORAL_CAPSULE | ORAL | Status: DC

## 2014-12-01 MED ORDER — CETIRIZINE HCL 10 MG PO TABS: 10.0000 mg | ORAL_TABLET | Freq: Every day | ORAL | Status: DC

## 2014-12-01 MED ORDER — DIPHENHYDRAMINE HCL 25 MG PO TABS: 25.0000 mg | ORAL_TABLET | Freq: Three times a day (TID) | ORAL | Status: DC | PRN

## 2014-12-01 NOTE — Patient Instructions (Addendum)
Take all medication as ordered  May take benadryl as needed for itching  Will call with lab results  Follow up in 3 weeks for f/u itching, rash and head pain. If no better, will consider dermatology vs neurology eval.

## 2014-12-01 NOTE — Progress Notes (Addendum)
Patient ID: Wendy Yang, female   DOB: Oct 17, 1925, 81 y.o.   MRN: [id number]    Location:    [facility]   Place of Service:   OFFICE   Advanced Directive information   none  Chief Complaint  Patient presents with  . Establish Care  . Medical Management of Chronic Issues    rt irratation from rash all over body     HPI:  81 yo female seen today as a new pt. She c/o right sided head pain since beginning of June. No known trauma. She was working in the yard prior to sx onset. She was seen in ED at [facility] for pain and swelling noted. No MRI performed as swelling and pain thought to be due to nerve and she rec'd a trigger point injection. Injection helped swelling and pain but she then developed a rash in her scalp. She saw her PCP and was dx with shingles. She was treated with acyclovir x 7 days which helped rash. She continues to have head pain along right scalp at V1, C2, C3 dermatomes. Ever since then, whenever she goes outside, later that night, she has itching around waistline when she removes her clothes for bed. She also notices "chiggers' bumps. No blisters. Her daughter, when she comes over, also experiences the itching of the waistline only at night when she removes her clothes for bed. She is gardening with new soil and wonders if there is something in soil causing her to itch  She has constant rhinorrhea. She has occasional sinus pressure. No dizziness. She has sensation that skin crawling on scalp. Occasional numbness in right arm.   She has hx diabetes but is diet controlled. Last A1c 5.8% 1 month ago  Past Medical History  Diagnosis Date  . Hypertension   . Diabetes mellitus without complication     No past surgical history on file.  Patient Care Team: Bonney Aid, MD as PCP - General (Student)  History   Social History  . Marital Status: Divorced    Spouse Name: N/A  . Number of Children: N/A  . Years of Education: N/A   Occupational History  . Not on  file.   Social History Main Topics  . Smoking status: Never Smoker   . Smokeless tobacco: Never Used  . Alcohol Use: No  . Drug Use: Not on file  . Sexual Activity: Not on file   Other Topics Concern  . Not on file   Social History Narrative   Diet:    Do you drink/eat things with caffeine? Coffee   Marital status: Divorced                        What year were you married? 1946   Do you live in a house, apartment, assisted living, condo, trailer, etc)? House   Is it one or more stories?  One   How many persons live in your home? 1   Do you have any pets in your home? No   Current or past profession: Food Packer   Do you exercise?  Yes                                                   Type & how often: Walk, yard work    Do you  have a living will?    Do you have a DNR Form?   Do you have a POA/HPOA forms?              reports that she has never smoked. She has never used smokeless tobacco. She reports that she does not drink alcohol. Her drug history is not on file.  Family History  Problem Relation Age of Onset  . Heart disease Mother   . Heart attack Mother   . Cancer Brother    Family Status  Relation Status Death Age  . Mother Deceased 32    Heart attack  . Brother Deceased 82    Cancer  . Father Deceased   . Brother Deceased 18    MVA  . Daughter Alive   . Son Alive   . Brother Deceased 24    Liver Failure  . Daughter Deceased   . Daughter Alive   . Daughter Alive   . Son Alive     Immunization History  Administered Date(s) Administered  . Hepatitis A 05/05/2012  . Influenza,inj,Quad PF,36+ Mos 03/06/2013    No Known Allergies  Medications: Patient's Medications  New Prescriptions   No medications on file  Previous Medications   ACETAMINOPHEN (TYLENOL) 325 MG TABLET    Take 325 mg by mouth daily as needed for mild pain or moderate pain.   ASPIRIN EC 81 MG TABLET    Take 1 tablet (81 mg total) by mouth daily.  Modified Medications   No  medications on file  Discontinued Medications   EPINEPHRINE (EPIPEN) 0.3 MG/0.3 ML IJ SOAJ INJECTION    Inject 0.3 mLs (0.3 mg total) into the muscle once.   HYDROCHLOROTHIAZIDE (HYDRODIURIL) 25 MG TABLET    Take 1 tablet (25 mg total) by mouth daily.   OMEPRAZOLE (PRILOSEC) 40 MG CAPSULE    Take 1 capsule (40 mg total) by mouth daily.   OVER THE COUNTER MEDICATION    Take 1 Package by mouth daily.   POTASSIUM GLUCONATE 595 MG CAPS    Take by mouth.   TRAMADOL (ULTRAM) 50 MG TABLET    Take 1 tablet (50 mg total) by mouth every 8 (eight) hours as needed.   VALACYCLOVIR (VALTREX) 1000 MG TABLET    Take 1 tablet (1,000 mg total) by mouth 3 (three) times daily.    Review of Systems  Constitutional: Negative for fever, chills, diaphoresis, activity change, appetite change and fatigue.  HENT: Positive for hearing loss (wears heaing aid). Negative for ear pain and sore throat.        Loss of taste  Eyes: Positive for visual disturbance (glaucoma).  Respiratory: Negative for cough, chest tightness and shortness of breath.   Cardiovascular: Negative for chest pain, palpitations and leg swelling.  Gastrointestinal: Negative for nausea, vomiting, abdominal pain, diarrhea, constipation and blood in stool.  Genitourinary: Negative for dysuria.  Musculoskeletal: Positive for arthralgias.  Skin: Positive for rash.  Allergic/Immunologic: Positive for environmental allergies.  Neurological: Negative for dizziness, tremors, numbness and headaches.  Psychiatric/Behavioral: Negative for sleep disturbance. The patient is not nervous/anxious.     Filed Vitals:   12/01/14 0823  BP: 120/82  Pulse: 71  Temp: 97.8 F (36.6 C)  TempSrc: Oral  Resp: 20  Height: 5\' 3"  (1.6 m)  Weight: 176 lb 3.2 oz (79.924 kg)  SpO2: 97%   Body mass index is 31.22 kg/(m^2).  Physical Exam  Constitutional: She is oriented to person, place, and time. She appears well-developed  and well-nourished.  HENT:  Head:     Mouth/Throat: Oropharynx is clear and moist. No oropharyngeal exudate.  TMs intact b/l, nonbulging. No redness  Eyes: Pupils are equal, round, and reactive to light. No scleral icterus.  Neck: Neck supple. Carotid bruit is not present. No tracheal deviation present. No thyromegaly present.  Cardiovascular: Normal rate, regular rhythm, normal heart sounds and intact distal pulses.  Exam reveals no gallop and no friction rub.   No murmur heard. Trace LE edema b/l. no calf TTP.   Pulmonary/Chest: Effort normal and breath sounds normal. No stridor. No respiratory distress. She has no wheezes. She has no rales.  Abdominal: Soft. Bowel sounds are normal. She exhibits no distension and no mass. There is no hepatomegaly. There is no tenderness. There is no rebound and no guarding.  Musculoskeletal: She exhibits edema and tenderness.  Lymphadenopathy:    She has no cervical adenopathy.  Neurological: She is alert and oriented to person, place, and time. She has normal reflexes.  Skin: Skin is warm and dry. Rash (different stages of urticaria, right anterior neck and right inguinal with excoriations. no burrows noted. right parietaloccipital swelling and TTP but no obvious rash) noted.  Psychiatric: She has a normal mood and affect. Her behavior is normal. Judgment and thought content normal.     Labs reviewed: Office Visit on 10/28/2014  Component Date Value Ref Range Status  . Hemoglobin A1C 10/28/2014 5.8   Final    No results found.   Assessment/Plan   ICD-9-CM ICD-10-CM   1. Urticaria of unknown origin 708.9 L50.9 TSH     Sedimentation Rate     CBC with Differential     predniSONE (DELTASONE) 10 MG tablet     cetirizine (ZYRTEC) 10 MG tablet     diphenhydrAMINE (BENADRYL) 25 MG tablet  2. Post herpetic neuralgia 053.19 B02.29 CMP     gabapentin (NEURONTIN) 100 MG capsule  3. Allergic rhinitis due to pollen 477.0 J30.1 cetirizine (ZYRTEC) 10 MG tablet  4. Prediabetes 790.29  R73.09 CMP     TSH     CBC with Differential  5. Right arm numbness - probably related to #6 782.0 R20.2 CMP     TSH     CBC with Differential     gabapentin (NEURONTIN) 100 MG capsule  6. Neck pain - probably related to arthritis 723.1 M54.2     --Take all medication as ordered  --May take benadryl as needed for itching  ---Will call with lab results  --t/c neck xray if pain does not improve after steroid tx  --Follow up in 3 weeks for f/u itching, rash and head pain. If no better, will consider dermatology vs neurology eval.  Onyx Edgley S. Ancil Linsey  [facility] [address] [place], [place] [address] [phone] Cell (Monday-Friday 8 AM - 5 PM) [phone] After 5 PM and follow prompts

## 2014-12-02 LAB — COMPREHENSIVE METABOLIC PANEL
ALBUMIN: 4.1 g/dL (ref 3.5–4.7)
ALK PHOS: 93 IU/L (ref 39–117)
ALT: 9 IU/L (ref 0–32)
AST: 10 IU/L (ref 0–40)
Albumin/Globulin Ratio: 1.6 (ref 1.1–2.5)
BILIRUBIN TOTAL: 0.3 mg/dL (ref 0.0–1.2)
BUN / CREAT RATIO: 15 (ref 11–26)
BUN: 14 mg/dL (ref 8–27)
CALCIUM: 9.7 mg/dL (ref 8.7–10.3)
CO2: 24 mmol/L (ref 18–29)
Chloride: 105 mmol/L (ref 97–108)
Creatinine, Ser: 0.95 mg/dL (ref 0.57–1.00)
GFR calc Af Amer: 62 mL/min/{1.73_m2} (ref >59)
GFR calc non Af Amer: 54 mL/min/{1.73_m2} — ABNORMAL LOW (ref >59)
GLUCOSE: 103 mg/dL — AB (ref 65–99)
Globulin, Total: 2.5 g/dL (ref 1.5–4.5)
Potassium: 4.5 mmol/L (ref 3.5–5.2)
SODIUM: 146 mmol/L — AB (ref 134–144)
Total Protein: 6.6 g/dL (ref 6.0–8.5)

## 2014-12-02 LAB — CBC WITH DIFFERENTIAL/PLATELET
Basophils Absolute: 0 10*3/uL (ref 0.0–0.2)
Basos: 0 %
EOS (ABSOLUTE): 0.1 10*3/uL (ref 0.0–0.4)
Eos: 2 %
Hematocrit: 35.1 % (ref 34.0–46.6)
Hemoglobin: 11.2 g/dL (ref 11.1–15.9)
IMMATURE GRANS (ABS): 0 10*3/uL (ref 0.0–0.1)
Immature Granulocytes: 0 %
Lymphocytes Absolute: 1.9 10*3/uL (ref 0.7–3.1)
Lymphs: 49 %
MCH: 29.2 pg (ref 26.6–33.0)
MCHC: 31.9 g/dL (ref 31.5–35.7)
MCV: 92 fL (ref 79–97)
Monocytes Absolute: 0.3 10*3/uL (ref 0.1–0.9)
Monocytes: 7 %
NEUTROS ABS: 1.7 10*3/uL (ref 1.4–7.0)
Neutrophils: 42 %
Platelets: 202 10*3/uL (ref 150–379)
RBC: 3.83 x10E6/uL (ref 3.77–5.28)
RDW: 15 % (ref 12.3–15.4)
WBC: 4 10*3/uL (ref 3.4–10.8)

## 2014-12-02 LAB — SEDIMENTATION RATE: Sed Rate: 9 mm/hr (ref 0–40)

## 2014-12-02 LAB — TSH: TSH: 3.77 u[IU]/mL (ref 0.450–4.500)

## 2014-12-24 ENCOUNTER — Encounter: Payer: Self-pay | Admitting: Internal Medicine

## 2014-12-24 ENCOUNTER — Ambulatory Visit (INDEPENDENT_AMBULATORY_CARE_PROVIDER_SITE_OTHER): Payer: Medicare Other | Admitting: Internal Medicine

## 2014-12-24 VITALS — BP 144/86 | HR 65 | Temp 98.1°F | Resp 20 | Ht 63.0 in | Wt 180.2 lb

## 2014-12-24 DIAGNOSIS — B0229 Other postherpetic nervous system involvement: Secondary | ICD-10-CM

## 2014-12-24 DIAGNOSIS — I10 Essential (primary) hypertension: Secondary | ICD-10-CM

## 2014-12-24 NOTE — Progress Notes (Signed)
Patient ID: Wendy Yang, female   DOB: 02-16-1926, 81 y.o.   MRN: [id number]    Location:    [facility]   Place of Service:  OFFICE   Chief Complaint  Patient presents with  . Medical Management of Chronic Issues    3 week follow for head pain and rash    HPI:  81 yo female seen today for f/u rash and head pain. She has not noticed any significant change in scalp pain on 200mg  gabapentin. She does have next day drowsiness. Urticarial rash resolved with prednisone taper.  Past Medical History  Diagnosis Date  . Hypertension   . Diabetes mellitus without complication     History reviewed. No pertinent past surgical history.  Patient Care Team: Bonney Aid, MD as PCP - General (Student)  Social History   Social History  . Marital Status: Divorced    Spouse Name: N/A  . Number of Children: N/A  . Years of Education: N/A   Occupational History  . Not on file.   Social History Main Topics  . Smoking status: Never Smoker   . Smokeless tobacco: Never Used  . Alcohol Use: No  . Drug Use: No  . Sexual Activity: Not on file   Other Topics Concern  . Not on file   Social History Narrative   Diet:    Do you drink/eat things with caffeine? Coffee   Marital status: Divorced                        What year were you married? 1946   Do you live in a house, apartment, assisted living, condo, trailer, etc)? House   Is it one or more stories?  One   How many persons live in your home? 1   Do you have any pets in your home? No   Current or past profession: Food Packer   Do you exercise?  Yes                                                   Type & how often: Walk, yard work    Do you have a living will?    Do you have a DNR Form?   Do you have a POA/HPOA forms?              reports that she has never smoked. She has never used smokeless tobacco. She reports that she does not drink alcohol or use illicit drugs.  No Known Allergies  Medications: Patient's Medications    New Prescriptions   No medications on file  Previous Medications   ACETAMINOPHEN (TYLENOL) 325 MG TABLET    Take 325 mg by mouth daily as needed for mild pain or moderate pain.   ASPIRIN EC 81 MG TABLET    Take 1 tablet (81 mg total) by mouth daily.   CETIRIZINE (ZYRTEC) 10 MG TABLET    Take 1 tablet (10 mg total) by mouth daily.   DIPHENHYDRAMINE (BENADRYL) 25 MG TABLET    Take 1 tablet (25 mg total) by mouth every 8 (eight) hours as needed for itching.   GABAPENTIN (NEURONTIN) 100 MG CAPSULE    Take 2 caps po qhs for nerve pain  Modified Medications   No medications on file  Discontinued Medications   PREDNISONE (  DELTASONE) 10 MG TABLET    Take 4 tabs po daily x 3 then 3 tabs po daily x 3 then 2 tabs po daily x 3 then 1 tab po daily x 3 and stop    Review of Systems  Constitutional: Negative for fever and chills.  HENT: Negative for ear pain and postnasal drip.   Skin: Negative for rash and wound.  Neurological: Positive for numbness and headaches. Negative for dizziness and seizures.  Psychiatric/Behavioral: Positive for sleep disturbance.    Filed Vitals:   12/24/14 1029  BP: 158/98 repeat BP by myself 144/86  Pulse: 65  Temp: 98.1 F (36.7 C)  TempSrc: Oral  Resp: 20  Height: 5\' 3"  (1.6 m)  Weight: 180 lb 3.2 oz (81.738 kg)  SpO2: 98%   Body mass index is 31.93 kg/(m^2).  Physical Exam  Constitutional: She is oriented to person, place, and time. She appears well-developed and well-nourished. No distress.  HENT:  Head:    Neck: Neck supple.  Lymphadenopathy:    She has no cervical adenopathy.  Neurological: She is alert and oriented to person, place, and time.  Skin: Skin is warm and dry. Rash (dry patch on neck. no redness) noted.  Psychiatric: She has a normal mood and affect. Her behavior is normal. Judgment and thought content normal.     Labs reviewed: Office Visit on 12/01/2014  Component Date Value Ref Range Status  . Glucose 12/01/2014 103* 65 - 99  mg/dL Final  . BUN 16/02/9603 14  8 - 27 mg/dL Final  . Creatinine, Ser 12/01/2014 0.95  0.57 - 1.00 mg/dL Final  . GFR calc non Af Amer 12/01/2014 54* >59 mL/min/1.73 Final  . GFR calc Af Amer 12/01/2014 62  >59 mL/min/1.73 Final  . BUN/Creatinine Ratio 12/01/2014 15  11 - 26 Final  . Sodium 12/01/2014 146* 134 - 144 mmol/L Final  . Potassium 12/01/2014 4.5  3.5 - 5.2 mmol/L Final  . Chloride 12/01/2014 105  97 - 108 mmol/L Final  . CO2 12/01/2014 24  18 - 29 mmol/L Final  . Calcium 12/01/2014 9.7  8.7 - 10.3 mg/dL Final  . Total Protein 12/01/2014 6.6  6.0 - 8.5 g/dL Final  . Albumin 54/01/8118 4.1  3.5 - 4.7 g/dL Final  . Globulin, Total 12/01/2014 2.5  1.5 - 4.5 g/dL Final  . Albumin/Globulin Ratio 12/01/2014 1.6  1.1 - 2.5 Final  . Bilirubin Total 12/01/2014 0.3  0.0 - 1.2 mg/dL Final  . Alkaline Phosphatase 12/01/2014 93  39 - 117 IU/L Final  . AST 12/01/2014 10  0 - 40 IU/L Final  . ALT 12/01/2014 9  0 - 32 IU/L Final  . TSH 12/01/2014 3.770  0.450 - 4.500 uIU/mL Final  . Sed Rate 12/01/2014 9  0 - 40 mm/hr Final  . WBC 12/01/2014 4.0  3.4 - 10.8 x10E3/uL Final  . RBC 12/01/2014 3.83  3.77 - 5.28 x10E6/uL Final  . Hemoglobin 12/01/2014 11.2  11.1 - 15.9 g/dL Final  . Hematocrit 14/78/2956 35.1  34.0 - 46.6 % Final  . MCV 12/01/2014 92  79 - 97 fL Final  . MCH 12/01/2014 29.2  26.6 - 33.0 pg Final  . MCHC 12/01/2014 31.9  31.5 - 35.7 g/dL Final  . RDW 21/30/8657 15.0  12.3 - 15.4 % Final  . Platelets 12/01/2014 202  150 - 379 x10E3/uL Final  . Neutrophils 12/01/2014 42   Final  . Lymphs 12/01/2014 49   Final  . Monocytes  12/01/2014 7   Final  . Eos 12/01/2014 2   Final  . Gardiner Sleeper 12/01/2014 0   Final  . Neutrophils Absolute 12/01/2014 1.7  1.4 - 7.0 x10E3/uL Final  . Lymphocytes Absolute 12/01/2014 1.9  0.7 - 3.1 x10E3/uL Final  . Monocytes Absolute 12/01/2014 0.3  0.1 - 0.9 x10E3/uL Final  . EOS (ABSOLUTE) 12/01/2014 0.1  0.0 - 0.4 x10E3/uL Final  . Basophils Absolute  12/01/2014 0.0  0.0 - 0.2 x10E3/uL Final  . Immature Granulocytes 12/01/2014 0   Final  . Immature Grans (Abs) 12/01/2014 0.0  0.0 - 0.1 x10E3/uL Final  Office Visit on 10/28/2014  Component Date Value Ref Range Status  . Hemoglobin A1C 10/28/2014 5.8   Final    No results found.   Assessment/Plan   ICD-9-CM ICD-10-CM   1. Post herpetic neuralgia - failing to change as expected 053.19 B02.29 Ambulatory referral to Neurology  2. Essential hypertension - borderline controlled 401.9 I10    Continue gabapentin at bedtime - will not increase dose due to c/a next day sedation worsening  Massage scalp as needed  Follow up in 3 mos for routine visit  Kamiyah Kindel S. Ancil Linsey  [facility] [address] [place], [place] [address] [phone] Cell (Monday-Friday 8 AM - 5 PM) [phone] After 5 PM and follow prompts

## 2014-12-24 NOTE — Patient Instructions (Signed)
Continue gabapentin at bedtime  Massage scalp as needed  Follow up in 3mos and as needed

## 2015-01-03 DIAGNOSIS — H4011X2 Primary open-angle glaucoma, moderate stage: Secondary | ICD-10-CM | POA: Diagnosis not present

## 2015-01-03 DIAGNOSIS — H02409 Unspecified ptosis of unspecified eyelid: Secondary | ICD-10-CM | POA: Diagnosis not present

## 2015-01-14 ENCOUNTER — Ambulatory Visit: Payer: Medicare Other | Admitting: Diagnostic Neuroimaging

## 2015-01-15 ENCOUNTER — Emergency Department (HOSPITAL_COMMUNITY)
Admission: EM | Admit: 2015-01-15 | Discharge: 2015-01-15 | Disposition: A | Discharge status: Home / Self Care | Payer: Medicare Other | Attending: Emergency Medicine | Admitting: Emergency Medicine

## 2015-01-15 ENCOUNTER — Encounter (HOSPITAL_COMMUNITY): Payer: Self-pay

## 2015-01-15 DIAGNOSIS — Z7982 Long term (current) use of aspirin: Secondary | ICD-10-CM | POA: Insufficient documentation

## 2015-01-15 DIAGNOSIS — W57XXXA Bitten or stung by nonvenomous insect and other nonvenomous arthropods, initial encounter: Secondary | ICD-10-CM | POA: Insufficient documentation

## 2015-01-15 DIAGNOSIS — Z79899 Other long term (current) drug therapy: Secondary | ICD-10-CM | POA: Diagnosis not present

## 2015-01-15 DIAGNOSIS — Y998 Other external cause status: Secondary | ICD-10-CM | POA: Insufficient documentation

## 2015-01-15 DIAGNOSIS — Y9289 Other specified places as the place of occurrence of the external cause: Secondary | ICD-10-CM | POA: Diagnosis not present

## 2015-01-15 DIAGNOSIS — E119 Type 2 diabetes mellitus without complications: Secondary | ICD-10-CM | POA: Insufficient documentation

## 2015-01-15 DIAGNOSIS — S20362A Insect bite (nonvenomous) of left front wall of thorax, initial encounter: Secondary | ICD-10-CM | POA: Insufficient documentation

## 2015-01-15 DIAGNOSIS — Y9389 Activity, other specified: Secondary | ICD-10-CM | POA: Diagnosis not present

## 2015-01-15 DIAGNOSIS — I10 Essential (primary) hypertension: Secondary | ICD-10-CM | POA: Diagnosis not present

## 2015-01-15 NOTE — Progress Notes (Signed)
Pt stated,"I am agravated and that is why my pressure is high. I take an herbal tea and do not take blood pressure medicine. That is what my doctor told me to do and that is what I plan to continue to do. Pt has a small reddened area on the laeft side of her upper chest that she says is a bite from a "japanese bug." No exudate noted and no warmth at the site.

## 2015-01-15 NOTE — ED Notes (Signed)
Pt c/o bee sting on L chest this afternoon.  Pain score 7/10.  Slight redness and swelling noted.  Pt reports drinking vinegar and putting vinegar on the sting.  NAD noted.  Pt easily speaking full sentences.

## 2015-01-15 NOTE — Discharge Instructions (Signed)
Insect Bite Mosquitoes, flies, fleas, bedbugs, and other insects can bite. Insect bites are different from insect stings. The bite may be red, puffy (swollen), and itchy for 2 to 4 days. Most bites get better on their own. HOME CARE   Do not scratch the bite.  Keep the bite clean and dry. Wash the bite with soap and water.  Put ice on the bite.  Put ice in a plastic bag.  Place a towel between your skin and the bag.  Leave the ice on for 20 minutes, 4 times a day. Do this for the first 2 to 3 days, or as told by your doctor.  You may use medicated lotions or creams to lessen itching as told by your doctor.  Only take medicines as told by your doctor.  If you are given medicines (antibiotics), take them as told. Finish them even if you start to feel better. You may need a tetanus shot if:  You cannot remember when you had your last tetanus shot.  You have never had a tetanus shot.  The injury broke your skin. If you need a tetanus shot and you choose not to have one, you may get tetanus. Sickness from tetanus can be serious. GET HELP RIGHT AWAY IF:   You have more pain, redness, or puffiness.  You see a red line on the skin coming from the bite.  You have a fever.  You have joint pain.  You have a headache or neck pain.  You feel weak.  You have a rash.  You have chest pain, or you are short of breath.  You have belly (abdominal) pain.  You feel sick to your stomach (nauseous) or throw up (vomit).  You feel very tired or sleepy. MAKE SURE YOU:   Understand these instructions.  Will watch your condition.  Will get help right away if you are not doing well or get worse. Document Released: 04/20/2000 Document Revised: 07/16/2011 Document Reviewed: 11/22/2010 ExitCare Patient Information 2015 [employer]. This information is not intended to replace advice given to you by your health care provider. Make sure you discuss any questions you have with your health  care provider.  

## 2015-01-15 NOTE — ED Provider Notes (Signed)
CSN: [id number]     Arrival date & time 01/15/15  1514 History  This chart was scribed for non-physician provider Fayrene Helper, PA-C, working with Laurence Spates, MD by Phillis Haggis, ED Scribe. This patient was seen in room WTR8/WTR8 and patient care was started at 5:50 PM.   Chief Complaint  Patient presents with  . Insect Bite  . Allergic Reaction   The history is provided by the patient. No language interpreter was used.  HPI Comments: Wendy Yang is a 81 y.o. Female with hx of HTN and DM who presents to the Emergency Department complaining of a bee sting to the left chest onset 3 hours ago. Pt states that she put vinegar on the area and drank some vinegar.She states that she is allergic to insect bites. Pt denies fever, chills, nausea, vomiting, throat swelling, SOB, chest pain, or abdominal pain.   Past Medical History  Diagnosis Date  . Hypertension   . Diabetes mellitus without complication    History reviewed. No pertinent past surgical history. Family History  Problem Relation Age of Onset  . Heart disease Mother   . Heart attack Mother   . Cancer Brother    Social History  Substance Use Topics  . Smoking status: Never Smoker   . Smokeless tobacco: Never Used  . Alcohol Use: No   OB History    No data available     Review of Systems  Constitutional: Negative for fever and chills.  HENT: Negative for sore throat and trouble swallowing.   Respiratory: Negative for shortness of breath.   Gastrointestinal: Negative for nausea, vomiting and abdominal pain.  Skin: Positive for rash (localized insect bite on L chest).   Allergies  Review of patient's allergies indicates no known allergies.  Home Medications   Prior to Admission medications   Medication Sig Start Date End Date Taking? Authorizing Provider  acetaminophen (TYLENOL) 325 MG tablet Take 325 mg by mouth daily as needed for mild pain or moderate pain.    Historical Provider, MD  aspirin EC 81 MG  tablet Take 1 tablet (81 mg total) by mouth daily. 02/09/14   Barbaraann Barthel, MD  cetirizine (ZYRTEC) 10 MG tablet Take 1 tablet (10 mg total) by mouth daily. 12/01/14   Kirt Boys, DO  diphenhydrAMINE (BENADRYL) 25 MG tablet Take 1 tablet (25 mg total) by mouth every 8 (eight) hours as needed for itching. 12/01/14   Kirt Boys, DO  gabapentin (NEURONTIN) 100 MG capsule Take 2 caps po qhs for nerve pain 12/01/14   Kirt Boys, DO   BP 174/73 mmHg  Pulse 65  Temp(Src) 98.1 F (36.7 C) (Oral)  Resp 16  SpO2 99%  Physical Exam  Constitutional: She is oriented to person, place, and time. She appears well-developed and well-nourished. No distress.  HENT:  Head: Normocephalic and atraumatic.  Mouth/Throat: Uvula is midline, oropharynx is clear and moist and mucous membranes are normal. No posterior oropharyngeal edema.  Eyes: EOM are normal. Pupils are equal, round, and reactive to light.  Neck: Normal range of motion. Neck supple.  Pulmonary/Chest: Effort normal. She has no wheezes.  Abdominal: Soft. There is no tenderness.  Musculoskeletal: Normal range of motion.  Neurological: She is alert and oriented to person, place, and time.  Skin: Skin is warm and dry. She is not diaphoretic.  Small localized skin irritation noted to left upper anterior chest with no erythema noted. No other skin rashes or hives.   Psychiatric: She  has a normal mood and affect. Her behavior is normal.  Nursing note and vitals reviewed.   ED Course  Procedures (including critical care time) DIAGNOSTIC STUDIES: Oxygen Saturation is 99% on RA, normal by my interpretation.    COORDINATION OF CARE: 5:54 PM-Discussed treatment plan with pt at bedside and pt agreed to plan.     MDM   Final diagnoses:  None   Pt is an 81 year old female who presents to the ED complaining of a bee sting to the left anterior chest 3 hours ago. Pt reports mild pain but has no other allergic reaction complaints. Due to the  patient having sat in the waiting room for 3 hrs with no acute SOB, throat swelling or other complaints, I do not feel it is necessary to give the pt anything prior to discharge. She is non-toxic appearing and ready for discharge. Discussed return precautions with pt and pt agrees to plan.   BP 174/73 mmHg  Pulse 65  Temp(Src) 98.1 F (36.7 C) (Oral)  Resp 16  SpO2 99%  I personally performed the services described in this documentation, which was scribed in my presence. The recorded information has been reviewed and is accurate.     Fayrene Helper, PA-C 01/15/15 1759  Laurence Spates, MD 01/15/15 [phone]

## 2015-03-30 ENCOUNTER — Encounter: Payer: Self-pay | Admitting: Internal Medicine

## 2015-03-30 ENCOUNTER — Ambulatory Visit (INDEPENDENT_AMBULATORY_CARE_PROVIDER_SITE_OTHER): Payer: Medicare Other | Admitting: Internal Medicine

## 2015-03-30 VITALS — BP 134/82 | HR 56 | Temp 97.3°F | Ht 63.0 in | Wt 175.0 lb

## 2015-03-30 DIAGNOSIS — Z23 Encounter for immunization: Secondary | ICD-10-CM

## 2015-03-30 DIAGNOSIS — I10 Essential (primary) hypertension: Secondary | ICD-10-CM | POA: Diagnosis not present

## 2015-03-30 DIAGNOSIS — B0229 Other postherpetic nervous system involvement: Secondary | ICD-10-CM | POA: Diagnosis not present

## 2015-03-30 DIAGNOSIS — R7303 Prediabetes: Secondary | ICD-10-CM | POA: Diagnosis not present

## 2015-03-30 DIAGNOSIS — R202 Paresthesia of skin: Secondary | ICD-10-CM | POA: Diagnosis not present

## 2015-03-30 DIAGNOSIS — R200 Anesthesia of skin: Secondary | ICD-10-CM

## 2015-03-30 MED ORDER — ZOSTER VACCINE LIVE 19400 UNT/0.65ML ~~LOC~~ SOLR: 0.6500 mL | Freq: Once | SUBCUTANEOUS | Status: DC

## 2015-03-30 MED ORDER — GABAPENTIN 100 MG PO CAPS: ORAL_CAPSULE | ORAL | Status: DC

## 2015-03-30 MED ORDER — TETANUS-DIPHTH-ACELL PERTUSSIS 5-2.5-18.5 LF-MCG/0.5 IM SUSP: 0.5000 mL | Freq: Once | INTRAMUSCULAR | Status: DC

## 2015-03-30 NOTE — Patient Instructions (Addendum)
Bring copy of Advance Directives- Health Care Power of Attorney and/or Living Will to next appointment.   No need to check for hepatitis C as she has normal liver enzymes and no history of high risk behavior or health care exposure  Start gabapentin for neuropathic pain  Continue current medications as ordered  Follow up in 3 mos for routine visit

## 2015-03-30 NOTE — Progress Notes (Signed)
Patient ID: Wendy Yang, female   DOB: [birth year], 81 y.o.   MRN: [id number]    Location:    [facility]   Place of Service:  OFFICE   Chief Complaint  Patient presents with  . Medical Management of Chronic Issues    3 month follow-up on HTN, DM (patient questions if she is DM). DM foot exam due. Patient would like labs to check for Hep C (recommended by her daughter). Patient needs rx or OTC recommendations for allergies   . Follow-up    Patient had shingles in June 2016, patient still with pain on right side.   . Immunizations    Flu Vaccine today     HPI:  81 yo female seen today for f/u. She noticed that her fingers and feet are very dry and scaly. She uses moisturizing lotion daily and uses gloves on her hands at night. She wears socks at night. Balance feels off.  HTN - BP stable. She does not take any meds. She drinks herbal tea  Postherpetic neuralgia - she still has postherpetic pain in right shoulder and neck. Her scalp itches on side where she had shingles. She said pharmacy never rec'd rx for gabapentin and she did not call office for new rx  Prediabetes - highest A1c 6.5% a few yrs ago. Last A1c 5.8% in June 2016. No numbness or tingling in hands/feet  Past Medical History  Diagnosis Date  . Hypertension   . Diabetes mellitus without complication (HCC)     History reviewed. No pertinent past surgical history.  Patient Care Team: Bonney Aid, MD as PCP - General (Student)  Social History   Social History  . Marital Status: Divorced    Spouse Name: N/A  . Number of Children: N/A  . Years of Education: N/A   Occupational History  . Not on file.   Social History Main Topics  . Smoking status: Never Smoker   . Smokeless tobacco: Never Used  . Alcohol Use: No  . Drug Use: No  . Sexual Activity: Not on file   Other Topics Concern  . Not on file   Social History Narrative   Diet:    Do you drink/eat things with caffeine? Coffee   Marital status:  Divorced                        What year were you married? 1946   Do you live in a house, apartment, assisted living, condo, trailer, etc)? House   Is it one or more stories?  One   How many persons live in your home? 1   Do you have any pets in your home? No   Current or past profession: Food Packer   Do you exercise?  Yes                                                   Type & how often: Walk, yard work    Do you have a living will?    Do you have a DNR Form?   Do you have a POA/HPOA forms?              reports that she has never smoked. She has never used smokeless tobacco. She reports that she does not drink alcohol or use illicit drugs.  No Known Allergies  Medications: Patient's Medications  New Prescriptions   No medications on file  Previous Medications   ACETAMINOPHEN (TYLENOL) 325 MG TABLET    Take 325 mg by mouth daily as needed for mild pain or moderate pain.   ASPIRIN EC 81 MG TABLET    Take 1 tablet (81 mg total) by mouth daily.  Modified Medications   Modified Medication Previous Medication   TDAP (BOOSTRIX) 5-2.5-18.5 LF-MCG/0.5 INJECTION Tdap (BOOSTRIX) 5-2.5-18.5 LF-MCG/0.5 injection      Inject 0.5 mLs into the muscle once.    Inject 0.5 mLs into the muscle once.   ZOSTER VACCINE LIVE, PF, (ZOSTAVAX) [id number] UNT/0.65ML INJECTION zoster vaccine live, PF, (ZOSTAVAX) [id number] UNT/0.65ML injection      Inject 19,400 Units into the skin once.    Inject 0.65 mLs into the skin once.  Discontinued Medications   CETIRIZINE (ZYRTEC) 10 MG TABLET    Take 1 tablet (10 mg total) by mouth daily.   DIPHENHYDRAMINE (BENADRYL) 25 MG TABLET    Take 1 tablet (25 mg total) by mouth every 8 (eight) hours as needed for itching.   GABAPENTIN (NEURONTIN) 100 MG CAPSULE    Take 2 caps po qhs for nerve pain    Review of Systems  Constitutional: Negative for fever, chills, diaphoresis, activity change, appetite change and fatigue.  HENT: Negative for ear pain and sore throat.   Eyes:  Negative for visual disturbance.  Respiratory: Negative for cough, chest tightness and shortness of breath.   Cardiovascular: Negative for chest pain, palpitations and leg swelling.  Gastrointestinal: Negative for nausea, vomiting, abdominal pain, diarrhea, constipation and blood in stool.  Genitourinary: Negative for dysuria.  Musculoskeletal: Positive for joint swelling, arthralgias and neck pain.  Neurological: Positive for numbness. Negative for dizziness, tremors and headaches.  Psychiatric/Behavioral: Negative for sleep disturbance. The patient is not nervous/anxious.     Filed Vitals:   03/30/15 1029  BP: 134/82  Pulse: 56  Temp: 97.3 F (36.3 C)  TempSrc: Oral  Height: 5\' 3"  (1.6 m)  Weight: 175 lb (79.379 kg)  SpO2: 98%   Body mass index is 31.01 kg/(m^2).  Physical Exam  Constitutional: She is oriented to person, place, and time. She appears well-developed and well-nourished. No distress.  HENT:  Mouth/Throat: Oropharynx is clear and moist. No oropharyngeal exudate.  Eyes: Pupils are equal, round, and reactive to light. No scleral icterus.  Neck: Neck supple. Carotid bruit is not present. No tracheal deviation present. No thyromegaly present.  Cardiovascular: Normal rate, regular rhythm, normal heart sounds and intact distal pulses.  Exam reveals no gallop and no friction rub.   No murmur heard. No LE edema b/l. no calf TTP.   Pulmonary/Chest: Effort normal and breath sounds normal. No stridor. No respiratory distress. She has no wheezes. She has no rales.  Abdominal: Soft. Bowel sounds are normal. She exhibits no distension and no mass. There is no hepatomegaly. There is no tenderness. There is no rebound and no guarding.  Musculoskeletal: She exhibits edema and tenderness.  Small and large joint swelling. MCP joint swelling noted  Lymphadenopathy:    She has no cervical adenopathy.  Neurological: She is alert and oriented to person, place, and time.  Skin: Skin is  warm and dry. No rash noted.  Psychiatric: She has a normal mood and affect. Her behavior is normal. Thought content normal.     Labs reviewed: No visits with results within 3 Month(s) from this visit. Latest known visit with  results is:  Office Visit on 12/01/2014  Component Date Value Ref Range Status  . Glucose 12/01/2014 103* 65 - 99 mg/dL Final  . BUN 16/02/9603 14  8 - 27 mg/dL Final  . Creatinine, Ser 12/01/2014 0.95  0.57 - 1.00 mg/dL Final  . GFR calc non Af Amer 12/01/2014 54* >59 mL/min/1.73 Final  . GFR calc Af Amer 12/01/2014 62  >59 mL/min/1.73 Final  . BUN/Creatinine Ratio 12/01/2014 15  11 - 26 Final  . Sodium 12/01/2014 146* 134 - 144 mmol/L Final  . Potassium 12/01/2014 4.5  3.5 - 5.2 mmol/L Final  . Chloride 12/01/2014 105  97 - 108 mmol/L Final  . CO2 12/01/2014 24  18 - 29 mmol/L Final  . Calcium 12/01/2014 9.7  8.7 - 10.3 mg/dL Final  . Total Protein 12/01/2014 6.6  6.0 - 8.5 g/dL Final  . Albumin 54/01/8118 4.1  3.5 - 4.7 g/dL Final  . Globulin, Total 12/01/2014 2.5  1.5 - 4.5 g/dL Final  . Albumin/Globulin Ratio 12/01/2014 1.6  1.1 - 2.5 Final  . Bilirubin Total 12/01/2014 0.3  0.0 - 1.2 mg/dL Final  . Alkaline Phosphatase 12/01/2014 93  39 - 117 IU/L Final  . AST 12/01/2014 10  0 - 40 IU/L Final  . ALT 12/01/2014 9  0 - 32 IU/L Final  . TSH 12/01/2014 3.770  0.450 - 4.500 uIU/mL Final  . Sed Rate 12/01/2014 9  0 - 40 mm/hr Final  . WBC 12/01/2014 4.0  3.4 - 10.8 x10E3/uL Final  . RBC 12/01/2014 3.83  3.77 - 5.28 x10E6/uL Final  . Hemoglobin 12/01/2014 11.2  11.1 - 15.9 g/dL Final  . Hematocrit 14/78/2956 35.1  34.0 - 46.6 % Final  . MCV 12/01/2014 92  79 - 97 fL Final  . MCH 12/01/2014 29.2  26.6 - 33.0 pg Final  . MCHC 12/01/2014 31.9  31.5 - 35.7 g/dL Final  . RDW 21/30/8657 15.0  12.3 - 15.4 % Final  . Platelets 12/01/2014 202  150 - 379 x10E3/uL Final  . Neutrophils 12/01/2014 42   Final  . Lymphs 12/01/2014 49   Final  . Monocytes 12/01/2014 7    Final  . Eos 12/01/2014 2   Final  . Basos 12/01/2014 0   Final  . Neutrophils Absolute 12/01/2014 1.7  1.4 - 7.0 x10E3/uL Final  . Lymphocytes Absolute 12/01/2014 1.9  0.7 - 3.1 x10E3/uL Final  . Monocytes Absolute 12/01/2014 0.3  0.1 - 0.9 x10E3/uL Final  . EOS (ABSOLUTE) 12/01/2014 0.1  0.0 - 0.4 x10E3/uL Final  . Basophils Absolute 12/01/2014 0.0  0.0 - 0.2 x10E3/uL Final  . Immature Granulocytes 12/01/2014 0   Final  . Immature Grans (Abs) 12/01/2014 0.0  0.0 - 0.1 x10E3/uL Final    No results found.   Assessment/Plan   ICD-9-CM ICD-10-CM   1. Post herpetic neuralgia - uncontrolled 053.19 B02.29 gabapentin (NEURONTIN) 100 MG capsule  2. Right arm numbness - due to #1 782.0 R20.2 gabapentin (NEURONTIN) 100 MG capsule  3. Prediabetes - diet controlled 790.29 R73.03   4. Essential hypertension - stable; diet controlled 401.9 I10     No need to check for hepatitis C as she has normal liver enzymes and no history of high risk behavior or health care exposure  Start gabapentin  qhs for neuropathic pain  Continue current medications as ordered  Declined DXA scan  Flu vaccine given today  Follow up in 3 mos for routine visit  Deshea Pooley S. Montez Morita,  [facility] [place], [place] [address] [phone] Cell (Monday-Friday 8 AM - 5 PM) [phone] After 5 PM and follow prompts

## 2015-04-05 DIAGNOSIS — H47233 Glaucomatous optic atrophy, bilateral: Secondary | ICD-10-CM | POA: Diagnosis not present

## 2015-07-01 ENCOUNTER — Ambulatory Visit: Payer: Medicare Other | Admitting: Internal Medicine

## 2015-07-08 DIAGNOSIS — H02409 Unspecified ptosis of unspecified eyelid: Secondary | ICD-10-CM | POA: Diagnosis not present

## 2015-07-08 DIAGNOSIS — H401132 Primary open-angle glaucoma, bilateral, moderate stage: Secondary | ICD-10-CM | POA: Diagnosis not present

## 2016-01-19 DIAGNOSIS — H401132 Primary open-angle glaucoma, bilateral, moderate stage: Secondary | ICD-10-CM | POA: Diagnosis not present

## 2016-01-19 DIAGNOSIS — H02409 Unspecified ptosis of unspecified eyelid: Secondary | ICD-10-CM | POA: Diagnosis not present

## 2016-01-23 DIAGNOSIS — Z961 Presence of intraocular lens: Secondary | ICD-10-CM | POA: Diagnosis not present

## 2016-01-23 DIAGNOSIS — H02402 Unspecified ptosis of left eyelid: Secondary | ICD-10-CM | POA: Diagnosis not present

## 2016-01-23 DIAGNOSIS — H538 Other visual disturbances: Secondary | ICD-10-CM | POA: Diagnosis not present

## 2016-01-31 DIAGNOSIS — H02402 Unspecified ptosis of left eyelid: Secondary | ICD-10-CM | POA: Diagnosis not present

## 2016-04-19 DIAGNOSIS — H02409 Unspecified ptosis of unspecified eyelid: Secondary | ICD-10-CM | POA: Diagnosis not present

## 2016-04-19 DIAGNOSIS — H401132 Primary open-angle glaucoma, bilateral, moderate stage: Secondary | ICD-10-CM | POA: Diagnosis not present

## 2016-08-14 ENCOUNTER — Ambulatory Visit (INDEPENDENT_AMBULATORY_CARE_PROVIDER_SITE_OTHER): Payer: Medicare Other | Admitting: Nurse Practitioner

## 2016-08-14 ENCOUNTER — Encounter: Payer: Self-pay | Admitting: Nurse Practitioner

## 2016-08-14 VITALS — BP 132/84 | HR 67 | Temp 97.3°F | Resp 17 | Ht 63.0 in | Wt 173.4 lb

## 2016-08-14 DIAGNOSIS — K219 Gastro-esophageal reflux disease without esophagitis: Secondary | ICD-10-CM

## 2016-08-14 DIAGNOSIS — J301 Allergic rhinitis due to pollen: Secondary | ICD-10-CM | POA: Diagnosis not present

## 2016-08-14 NOTE — Patient Instructions (Signed)
Please schedule AWV with routine follow up with Dr Montez Morita   Nettipot twice daily for 1 week then as needed Plain nasal saline spray as needed Claritin (loratadine) 10 mg daily  flonase 1 spray into both nares twice daily  Allergic Rhinitis Allergic rhinitis is when the mucous membranes in the nose respond to allergens. Allergens are particles in the air that cause your body to have an allergic reaction. This causes you to release allergic antibodies. Through a chain of events, these eventually cause you to release histamine into the blood stream. Although meant to protect the body, it is this release of histamine that causes your discomfort, such as frequent sneezing, congestion, and an itchy, runny nose. What are the causes? Seasonal allergic rhinitis (hay fever) is caused by pollen allergens that may come from grasses, trees, and weeds. Year-round allergic rhinitis (perennial allergic rhinitis) is caused by allergens such as house dust mites, pet dander, and mold spores. What are the signs or symptoms?  Nasal stuffiness (congestion).  Itchy, runny nose with sneezing and tearing of the eyes. How is this diagnosed? Your health care provider can help you determine the allergen or allergens that trigger your symptoms. If you and your health care provider are unable to determine the allergen, skin or blood testing may be used. Your health care provider will diagnose your condition after taking your health history and performing a physical exam. Your health care provider may assess you for other related conditions, such as asthma, pink eye, or an ear infection. How is this treated? Allergic rhinitis does not have a cure, but it can be controlled by:  Medicines that block allergy symptoms. These may include allergy shots, nasal sprays, and oral antihistamines.  Avoiding the allergen. Hay fever may often be treated with antihistamines in pill or nasal spray forms. Antihistamines block the effects  of histamine. There are over-the-counter medicines that may help with nasal congestion and swelling around the eyes. Check with your health care provider before taking or giving this medicine. If avoiding the allergen or the medicine prescribed do not work, there are many new medicines your health care provider can prescribe. Stronger medicine may be used if initial measures are ineffective. Desensitizing injections can be used if medicine and avoidance does not work. Desensitization is when a patient is given ongoing shots until the body becomes less sensitive to the allergen. Make sure you follow up with your health care provider if problems continue. Follow these instructions at home: It is not possible to completely avoid allergens, but you can reduce your symptoms by taking steps to limit your exposure to them. It helps to know exactly what you are allergic to so that you can avoid your specific triggers. Contact a health care provider if:  You have a fever.  You develop a cough that does not stop easily (persistent).  You have shortness of breath.  You start wheezing.  Symptoms interfere with normal daily activities. This information is not intended to replace advice given to you by your health care provider. Make sure you discuss any questions you have with your health care provider. Document Released: 01/16/2001 Document Revised: 12/23/2015 Document Reviewed: 12/29/2012 Elsevier Interactive Patient Education  2017 [employer].

## 2016-08-14 NOTE — Progress Notes (Signed)
Careteam: Patient Care Team: Kirt Boys, DO as PCP - General (Internal Medicine)  Advanced Directive information Does Patient Have a Medical Advance Directive?: Yes, Type of Advance Directive: Living will  No Known Allergies  Chief Complaint  Patient presents with  . Acute Visit    Pt has been having sinus drainage, sore throat, headache, sneezing x 1 week. Pt is having some pain in left lower back and side when she takes a deep breath since shingles outbreak 1 year ago.     HPI: Patient is a 83 y.o. female seen in the office today for headache occurring for greater than one week. She describes the pain as a dull, constant frontal headache with no reports of dizziness, syncope, or changes in vision. She has been taking intermittent Tylenol 1 tab PO at night every 2-3 days with relief, however the headache will return by the next day. She has clear nasal drainage, itchy, watery eyes and a sore throat. She has had allergies in the past, but does not remember them being so bad. Denies chest pain, wheezing, or SOB. She was concerned that her headaches were coming from her dentition in her upper right and left jaw, which she states that she will need evaluated. No reports of fevers, chills, or myalgias.    She also has c/o mid epi-gastric pain. She has a history of GERD in which she takes Zantac intermittently for her symptoms. She states that her symptoms are exacerbated by spicy foods that she avoids if she can.   Review of Systems:  Review of Systems  Constitutional: Negative for activity change, appetite change, chills, diaphoresis, fatigue, fever and unexpected weight change.  HENT: Positive for postnasal drip, rhinorrhea and sore throat. Negative for sinus pain, sinus pressure, tinnitus and trouble swallowing.   Eyes: Positive for discharge and itching. Negative for photophobia, redness and visual disturbance.  Respiratory: Negative for cough, chest tightness, shortness of breath  and wheezing.   Cardiovascular: Negative for chest pain, palpitations and leg swelling.  Gastrointestinal: Positive for abdominal pain. Negative for constipation, diarrhea, nausea and vomiting.       Epigastric, related to her GERD  Neurological: Positive for headaches. Negative for dizziness, syncope, weakness and light-headedness.  Psychiatric/Behavioral: Negative.     Past Medical History:  Diagnosis Date  . Diabetes mellitus without complication (HCC)   . Hypertension    History reviewed. No pertinent surgical history. Social History:   reports that she has never smoked. She has never used smokeless tobacco. She reports that she does not drink alcohol or use drugs.  Family History  Problem Relation Age of Onset  . Heart disease Mother   . Heart attack Mother   . Cancer Brother     Medications: Patient's Medications  New Prescriptions   No medications on file  Previous Medications   ACETAMINOPHEN (TYLENOL) 325 MG TABLET    Take 325 mg by mouth daily as needed for mild pain or moderate pain.   ASPIRIN EC 81 MG TABLET    Take 1 tablet (81 mg total) by mouth daily.   GABAPENTIN (NEURONTIN) 100 MG CAPSULE    Take 2 caps po qhs for nerve pain  Modified Medications   No medications on file  Discontinued Medications   TDAP (BOOSTRIX) 5-2.5-18.5 LF-MCG/0.5 INJECTION    Inject 0.5 mLs into the muscle once.   ZOSTER VACCINE LIVE, PF, (ZOSTAVAX) 09811 UNT/0.65ML INJECTION    Inject 19,400 Units into the skin once.  Physical Exam:  Vitals:   08/14/16 0931  BP: 132/84  Pulse: 67  Resp: 17  Temp: 97.3 F (36.3 C)  TempSrc: Oral  SpO2: 97%  Weight: 173 lb 6.4 oz (78.7 kg)  Height:  (1.6 m)   Body mass index is 30.72 kg/m.  Physical Exam  Constitutional: She appears well-developed and well-nourished. No distress.  HENT:  Head: Normocephalic and atraumatic.  Right Ear: External ear normal.  Left Ear: External ear normal.  Nose: Mucosal edema and rhinorrhea  present. No septal deviation.  Mouth/Throat: Mucous membranes are not pale and not dry. Posterior oropharyngeal erythema present. No oropharyngeal exudate or posterior oropharyngeal edema.  Skin: She is not diaphoretic.    Labs reviewed: Basic Metabolic Panel: No results for input(s): NA, K, CL, CO2, GLUCOSE, BUN, CREATININE, CALCIUM, MG, PHOS, TSH in the last 8760 hours. Liver Function Tests: No results for input(s): AST, ALT, ALKPHOS, BILITOT, PROT, ALBUMIN in the last 8760 hours. No results for input(s): LIPASE, AMYLASE in the last 8760 hours. No results for input(s): AMMONIA in the last 8760 hours. CBC: No results for input(s): WBC, NEUTROABS, HGB, HCT, MCV, PLT in the last 8760 hours. Lipid Panel: No results for input(s): CHOL, HDL, LDLCALC, TRIG, CHOLHDL, LDLDIRECT in the last 8760 hours. TSH: No results for input(s): TSH in the last 8760 hours. A1C: Lab Results  Component Value Date   HGBA1C 5.8 10/28/2014     Assessment/Plan 1. Acute seasonal allergic rhinitis due to pollen -Will have her add Claritin OTC daily  -Neti pot for nasal congestion BID for 1 week, then as needed for comfort -Tylenol 1-2 tabs PO Q 6h for headache pain.  -Flonase OTC 2 sprays per nostril QD for nasal congestion and inflammation.  -If above therapies do not help, pt to call office for further follow up. -She is to schedule annual wellness exam, will have labs drawn at that time  2. Gastroesophageal reflux disease without esophagitis -Continue Zantac PRN for GERD symtpoms -Educated pt to avoid precipitating foods (spicy) -If symptoms worsen and pt is having to take Zantac several times per day, call the office for appointment and further follow up plans.   Janene Harvey. Biagio Borg  [facility] [phone] 8 am - 5 pm) [phone] (after hours)

## 2016-09-05 DIAGNOSIS — H401113 Primary open-angle glaucoma, right eye, severe stage: Secondary | ICD-10-CM | POA: Diagnosis not present

## 2016-09-05 DIAGNOSIS — H401121 Primary open-angle glaucoma, left eye, mild stage: Secondary | ICD-10-CM | POA: Diagnosis not present

## 2016-09-05 DIAGNOSIS — H16223 Keratoconjunctivitis sicca, not specified as Sjogren's, bilateral: Secondary | ICD-10-CM | POA: Diagnosis not present

## 2016-09-23 ENCOUNTER — Other Ambulatory Visit: Payer: Self-pay | Admitting: Internal Medicine

## 2016-09-23 DIAGNOSIS — I10 Essential (primary) hypertension: Secondary | ICD-10-CM

## 2016-09-23 DIAGNOSIS — Z136 Encounter for screening for cardiovascular disorders: Secondary | ICD-10-CM

## 2016-09-23 DIAGNOSIS — Z13220 Encounter for screening for lipoid disorders: Secondary | ICD-10-CM

## 2016-09-23 DIAGNOSIS — R7303 Prediabetes: Secondary | ICD-10-CM

## 2016-10-08 ENCOUNTER — Encounter: Payer: Self-pay | Admitting: Internal Medicine

## 2016-10-23 DIAGNOSIS — H401113 Primary open-angle glaucoma, right eye, severe stage: Secondary | ICD-10-CM | POA: Diagnosis not present

## 2016-10-23 LAB — HM DIABETES EYE EXAM

## 2016-11-12 ENCOUNTER — Other Ambulatory Visit: Payer: Medicare Other

## 2016-11-12 ENCOUNTER — Ambulatory Visit: Payer: Medicare Other

## 2016-11-12 DIAGNOSIS — R7303 Prediabetes: Secondary | ICD-10-CM

## 2016-11-12 DIAGNOSIS — Z13220 Encounter for screening for lipoid disorders: Secondary | ICD-10-CM | POA: Diagnosis not present

## 2016-11-12 DIAGNOSIS — I10 Essential (primary) hypertension: Secondary | ICD-10-CM | POA: Diagnosis not present

## 2016-11-12 DIAGNOSIS — Z136 Encounter for screening for cardiovascular disorders: Secondary | ICD-10-CM | POA: Diagnosis not present

## 2016-11-12 LAB — CBC WITH DIFFERENTIAL/PLATELET
BASOS PCT: 0 %
Basophils Absolute: 0 cells/uL (ref 0–200)
EOS PCT: 2 %
Eosinophils Absolute: 104 cells/uL (ref 15–500)
HCT: 38 % (ref 35.0–45.0)
Hemoglobin: 11.8 g/dL (ref 11.7–15.5)
Lymphocytes Relative: 44 %
Lymphs Abs: 2288 cells/uL (ref 850–3900)
MCH: 29.7 pg (ref 27.0–33.0)
MCHC: 31.1 g/dL — AB (ref 32.0–36.0)
MCV: 95.7 fL (ref 80.0–100.0)
MPV: 11 fL (ref 7.5–12.5)
Monocytes Absolute: 468 cells/uL (ref 200–950)
Monocytes Relative: 9 %
Neutro Abs: 2340 cells/uL (ref 1500–7800)
Neutrophils Relative %: 45 %
PLATELETS: 219 10*3/uL (ref 140–400)
RBC: 3.97 MIL/uL (ref 3.80–5.10)
RDW: 14.3 % (ref 11.0–15.0)
WBC: 5.2 10*3/uL (ref 3.8–10.8)

## 2016-11-12 LAB — TSH: TSH: 5.96 m[IU]/L — AB

## 2016-11-13 ENCOUNTER — Other Ambulatory Visit: Payer: Self-pay

## 2016-11-13 DIAGNOSIS — R7989 Other specified abnormal findings of blood chemistry: Secondary | ICD-10-CM

## 2016-11-13 DIAGNOSIS — Z13220 Encounter for screening for lipoid disorders: Secondary | ICD-10-CM | POA: Diagnosis not present

## 2016-11-13 DIAGNOSIS — I10 Essential (primary) hypertension: Secondary | ICD-10-CM | POA: Diagnosis not present

## 2016-11-13 DIAGNOSIS — Z136 Encounter for screening for cardiovascular disorders: Secondary | ICD-10-CM | POA: Diagnosis not present

## 2016-11-13 DIAGNOSIS — R7303 Prediabetes: Secondary | ICD-10-CM | POA: Diagnosis not present

## 2016-11-13 DIAGNOSIS — E7800 Pure hypercholesterolemia, unspecified: Secondary | ICD-10-CM

## 2016-11-13 LAB — COMPLETE METABOLIC PANEL WITH GFR
ALBUMIN: 4 g/dL (ref 3.6–5.1)
ALT: 6 U/L (ref 6–29)
AST: 14 U/L (ref 10–35)
Alkaline Phosphatase: 98 U/L (ref 33–130)
BUN: 18 mg/dL (ref 7–25)
CO2: 22 mmol/L (ref 20–31)
CREATININE: 1.17 mg/dL — AB (ref 0.60–0.88)
Calcium: 9.4 mg/dL (ref 8.6–10.4)
Chloride: 107 mmol/L (ref 98–110)
GFR, EST AFRICAN AMERICAN: 48 mL/min — AB (ref >=60)
GFR, Est Non African American: 41 mL/min — ABNORMAL LOW (ref >=60)
GLUCOSE: 99 mg/dL (ref 65–99)
Potassium: 5 mmol/L (ref 3.5–5.3)
Sodium: 144 mmol/L (ref 135–146)
TOTAL PROTEIN: 6.8 g/dL (ref 6.1–8.1)
Total Bilirubin: 0.3 mg/dL (ref 0.2–1.2)

## 2016-11-13 LAB — LIPID PANEL
Cholesterol: 244 mg/dL — ABNORMAL HIGH (ref <200)
HDL: 81 mg/dL (ref >50)
LDL CALC: 150 mg/dL — AB (ref <100)
TRIGLYCERIDES: 63 mg/dL (ref <150)
Total CHOL/HDL Ratio: 3 Ratio (ref <5.0)
VLDL: 13 mg/dL (ref <30)

## 2016-11-13 LAB — HEMOGLOBIN A1C
Hgb A1c MFr Bld: 5.9 % — ABNORMAL HIGH (ref <5.7)
Mean Plasma Glucose: 123 mg/dL

## 2016-11-15 ENCOUNTER — Ambulatory Visit (INDEPENDENT_AMBULATORY_CARE_PROVIDER_SITE_OTHER): Payer: Medicare Other

## 2016-11-15 ENCOUNTER — Other Ambulatory Visit: Payer: Medicare Other

## 2016-11-15 VITALS — BP 138/80 | HR 57 | Temp 97.6°F | Ht 63.0 in | Wt 174.0 lb

## 2016-11-15 DIAGNOSIS — Z0000 Encounter for general adult medical examination without abnormal findings: Secondary | ICD-10-CM | POA: Diagnosis not present

## 2016-11-15 MED ORDER — TETANUS-DIPHTH-ACELL PERTUSSIS 5-2.5-18.5 LF-MCG/0.5 IM SUSP: 0.5000 mL | Freq: Once | INTRAMUSCULAR | 0 refills | Status: AC

## 2016-11-15 MED ORDER — ZOSTER VAC RECOMB ADJUVANTED 50 MCG/0.5ML IM SUSR: 0.5000 mL | Freq: Once | INTRAMUSCULAR | 1 refills | Status: AC

## 2016-11-15 NOTE — Addendum Note (Signed)
Addended by: Annetta MawGONTHIER, SARA E on: 11/15/2016 10:50 AM   Modules accepted: Orders

## 2016-11-15 NOTE — Progress Notes (Signed)
Subjective:   Wendy Yang is a 83 y.o. female who presents for an Initial Medicare Annual Wellness Visit.     Objective:    Today's Vitals   11/15/16 0904  BP: 138/80  Pulse: (!) 57  Temp: 97.6 F (36.4 C)  TempSrc: Oral  SpO2: 98%  Weight: 174 lb (78.9 kg)  Height: 5\' 3"  (1.6 m)  PainSc: 5    Body mass index is 30.82 kg/m.   Current Medications (verified) Outpatient Encounter Prescriptions as of 11/15/2016  Medication Sig  . acetaminophen (TYLENOL) 325 MG tablet Take 325 mg by mouth daily as needed for mild pain or moderate pain.  Marland Kitchen aspirin EC 81 MG tablet Take 1 tablet (81 mg total) by mouth daily.  Marland Kitchen gabapentin (NEURONTIN) 100 MG capsule Take 2 caps po qhs for nerve pain   No facility-administered encounter medications on file as of 11/15/2016.     Allergies (verified) Patient has no known allergies.   History: Past Medical History:  Diagnosis Date  . Diabetes mellitus without complication (HCC)   . Hypertension    History reviewed. No pertinent surgical history. Family History  Problem Relation Age of Onset  . Heart disease Mother   . Heart attack Mother   . Cancer Brother    Social History   Occupational History  . Not on file.   Social History Main Topics  . Smoking status: Never Smoker  . Smokeless tobacco: Never Used  . Alcohol use No  . Drug use: No  . Sexual activity: Not Currently    Tobacco Counseling Counseling given: Not Answered   Activities of Daily Living In your present state of health, do you have any difficulty performing the following activities: 11/15/2016  Hearing? N  Vision? N  Difficulty concentrating or making decisions? N  Walking or climbing stairs? Y  Dressing or bathing? N  Doing errands, shopping? N  Preparing Food and eating ? N  Using the Toilet? N  In the past six months, have you accidently leaked urine? N  Do you have problems with loss of bowel control? N  Managing your Medications? N  Managing  your Finances? N  Housekeeping or managing your Housekeeping? N  Some recent data might be hidden    Immunizations and Health Maintenance Immunization History  Administered Date(s) Administered  . Hepatitis A 05/05/2012  . Influenza,inj,Quad PF,36+ Mos 03/06/2013, 03/30/2015   Health Maintenance Due  Topic Date Due  . OPHTHALMOLOGY EXAM  06/28/1935  . URINE MICROALBUMIN  06/28/1937  . TETANUS/TDAP  06/28/1946  . PNA vac Low Risk Adult (1 of 2 - PCV13) 06/28/1992  . FOOT EXAM  02/10/2015    Patient Care Team: Kirt Boys, DO as PCP - General (Internal Medicine)  Indicate any recent Medical Services you may have received from other than [facility] providers in the past year (date may be approximate).     Assessment:   This is a routine wellness examination for [facility].   Hearing/Vision screen No exam data present  Dietary issues and exercise activities discussed: Current Exercise Habits: The patient does not participate in regular exercise at present, Exercise limited by: None identified  Goals    . Maintain Lifestyle          Pt will maintain lifestyle.       Depression Screen PHQ 2/9 Scores 11/15/2016 12/01/2014 10/28/2014 10/19/2014 05/11/2014 02/09/2014 10/02/2013  PHQ - 2 Score 0 0 0 0 0 0 0    Fall Risk Fall  Risk  11/15/2016 12/24/2014 12/01/2014 10/28/2014 10/19/2014  Falls in the past year? No No No No No    Cognitive Function: MMSE - Mini Mental State Exam 11/15/2016  Orientation to time 4  Orientation to Place 5  Registration 3  Attention/ Calculation 1  Recall 2  Language- name 2 objects 2  Language- repeat 1  Language- follow 3 step command 2        Screening Tests Health Maintenance  Topic Date Due  . OPHTHALMOLOGY EXAM  06/28/1937  . URINE MICROALBUMIN  06/28/1937  . TETANUS/TDAP  06/28/1946  . PNA vac Low Risk Adult (1 of 2 - PCV13) 06/28/1992  . FOOT EXAM  02/10/2015  . INFLUENZA VACCINE  12/05/2016  . HEMOGLOBIN A1C  05/15/2017  . DEXA SCAN   Completed      Plan:    I have personally reviewed and addressed the Medicare Annual Wellness questionnaire and have noted the following in the patient's chart:  A. Medical and social history B. Use of alcohol, tobacco or illicit drugs  C. Current medications and supplements D. Functional ability and status E.  Nutritional status F.  Physical activity G. Advance directives H. List of other physicians I.  Hospitalizations, surgeries, and ER visits in previous 12 months J.  Vitals K. Screenings to include hearing, vision, cognitive, depression L. Referrals and appointments - none  In addition, I have reviewed and discussed with patient certain preventive protocols, quality metrics, and best practice recommendations. A written personalized care plan for preventive services as well as general preventive health recommendations were provided to patient.  See attached scanned questionnaire for additional information.   Signed,   Annetta MawSara Gonthier, RN Nurse Health Advisor   Quick Notes   Health Maintenance: Pneumonia vaccine due, not sure which one based on pt comments. She will call insurance company to find out. TDAP and shingles vaccine prescriptions sent to pharmacy     Abnormal Screen: Did not pass clock drawing. MMSE 23/30     Patient Concerns: Pt requesting Lasix for BLE swelling.x     Nurse Concerns: None

## 2016-11-15 NOTE — Patient Instructions (Addendum)
Wendy Yang , Thank you for taking time to come for your Medicare Wellness Visit. I appreciate your ongoing commitment to your health goals. Please review the following plan we discussed and let me know if I can assist you in the future.   Screening recommendations/referrals: Colonoscopy up to date, pt over age 81 Mammogram up to date, pt over age 58 Bone Density up to date Recommended yearly ophthalmology/optometry visit for glaucoma screening and checkup Recommended yearly dental visit for hygiene and checkup  Vaccinations: Influenza vaccine due this upcoming flu season Pneumococcal vaccine due. Call insurance company and see if you got the Prevnar or the Pneumovax 3-4 years ago Tdap vaccine due, I will send prescription to your pharmacy Shingles vaccine due, I will send prescription to your pharmacy  Advanced directives: Advance directive discussed with you today. I have provided a copy for you to complete at home and have notarized. Once this is complete please bring a copy in to our office so we can scan it into your chart.   Conditions/risks identified: None  Next appointment: 11/16/16 Dr. Montez Morita @ 9:45 am   Preventive Care 65 Years and Older, Female Preventive care refers to lifestyle choices and visits with your health care provider that can promote health and wellness. What does preventive care include?  A yearly physical exam. This is also called an annual well check.  Dental exams once or twice a year.  Routine eye exams. Ask your health care provider how often you should have your eyes checked.  Personal lifestyle choices, including:  Daily care of your teeth and gums.  Regular physical activity.  Eating a healthy diet.  Avoiding tobacco and drug use.  Limiting alcohol use.  Practicing safe sex.  Taking low-dose aspirin every day.  Taking vitamin and mineral supplements as recommended by your health care provider. What happens during an annual well  check? The services and screenings done by your health care provider during your annual well check will depend on your age, overall health, lifestyle risk factors, and family history of disease. Counseling  Your health care provider may ask you questions about your:  Alcohol use.  Tobacco use.  Drug use.  Emotional well-being.  Home and relationship well-being.  Sexual activity.  Eating habits.  History of falls.  Memory and ability to understand (cognition).  Work and work Astronomer.  Reproductive health. Screening  You may have the following tests or measurements:  Height, weight, and BMI.  Blood pressure.  Lipid and cholesterol levels. These may be checked every 5 years, or more frequently if you are over 97 years old.  Skin check.  Lung cancer screening. You may have this screening every year starting at age 9 if you have a 30-pack-year history of smoking and currently smoke or have quit within the past 15 years.  Fecal occult blood test (FOBT) of the stool. You may have this test every year starting at age 56.  Flexible sigmoidoscopy or colonoscopy. You may have a sigmoidoscopy every 5 years or a colonoscopy every 10 years starting at age 68.  Hepatitis C blood test.  Hepatitis B blood test.  Sexually transmitted disease (STD) testing.  Diabetes screening. This is done by checking your blood sugar (glucose) after you have not eaten for a while (fasting). You may have this done every 1-3 years.  Bone density scan. This is done to screen for osteoporosis. You may have this done starting at age 48.  Mammogram. This may be done every  1-2 years. Talk to your health care provider about how often you should have regular mammograms. Talk with your health care provider about your test results, treatment options, and if necessary, the need for more tests. Vaccines  Your health care provider may recommend certain vaccines, such as:  Influenza vaccine. This is  recommended every year.  Tetanus, diphtheria, and acellular pertussis (Tdap, Td) vaccine. You may need a Td booster every 10 years.  Zoster vaccine. You may need this after age 47.  Pneumococcal 13-valent conjugate (PCV13) vaccine. One dose is recommended after age 78.  Pneumococcal polysaccharide (PPSV23) vaccine. One dose is recommended after age 8. Talk to your health care provider about which screenings and vaccines you need and how often you need them. This information is not intended to replace advice given to you by your health care provider. Make sure you discuss any questions you have with your health care provider. Document Released: 05/20/2015 Document Revised: 01/11/2016 Document Reviewed: 02/22/2015 Elsevier Interactive Patient Education  2017 [place] Prevention in the Home Falls can cause injuries. They can happen to people of all ages. There are many things you can do to make your home safe and to help prevent falls. What can I do on the outside of my home?  Regularly fix the edges of walkways and driveways and fix any cracks.  Remove anything that might make you trip as you walk through a door, such as a raised step or threshold.  Trim any bushes or trees on the path to your home.  Use bright outdoor lighting.  Clear any walking paths of anything that might make someone trip, such as rocks or tools.  Regularly check to see if handrails are loose or broken. Make sure that both sides of any steps have handrails.  Any raised decks and porches should have guardrails on the edges.  Have any leaves, snow, or ice cleared regularly.  Use sand or salt on walking paths during winter.  Clean up any spills in your garage right away. This includes oil or grease spills. What can I do in the bathroom?  Use night lights.  Install grab bars by the toilet and in the tub and shower. Do not use towel bars as grab bars.  Use non-skid mats or decals in the tub or  shower.  If you need to sit down in the shower, use a plastic, non-slip stool.  Keep the floor dry. Clean up any water that spills on the floor as soon as it happens.  Remove soap buildup in the tub or shower regularly.  Attach bath mats securely with double-sided non-slip rug tape.  Do not have throw rugs and other things on the floor that can make you trip. What can I do in the bedroom?  Use night lights.  Make sure that you have a light by your bed that is easy to reach.  Do not use any sheets or blankets that are too big for your bed. They should not hang down onto the floor.  Have a firm chair that has side arms. You can use this for support while you get dressed.  Do not have throw rugs and other things on the floor that can make you trip. What can I do in the kitchen?  Clean up any spills right away.  Avoid walking on wet floors.  Keep items that you use a lot in easy-to-reach places.  If you need to reach something above you, use a strong  step stool that has a grab bar.  Keep electrical cords out of the way.  Do not use floor polish or wax that makes floors slippery. If you must use wax, use non-skid floor wax.  Do not have throw rugs and other things on the floor that can make you trip. What can I do with my stairs?  Do not leave any items on the stairs.  Make sure that there are handrails on both sides of the stairs and use them. Fix handrails that are broken or loose. Make sure that handrails are as long as the stairways.  Check any carpeting to make sure that it is firmly attached to the stairs. Fix any carpet that is loose or worn.  Avoid having throw rugs at the top or bottom of the stairs. If you do have throw rugs, attach them to the floor with carpet tape.  Make sure that you have a light switch at the top of the stairs and the bottom of the stairs. If you do not have them, ask someone to add them for you. What else can I do to help prevent  falls?  Wear shoes that:  Do not have high heels.  Have rubber bottoms.  Are comfortable and fit you well.  Are closed at the toe. Do not wear sandals.  If you use a stepladder:  Make sure that it is fully opened. Do not climb a closed stepladder.  Make sure that both sides of the stepladder are locked into place.  Ask someone to hold it for you, if possible.  Clearly mark and make sure that you can see:  Any grab bars or handrails.  First and last steps.  Where the edge of each step is.  Use tools that help you move around (mobility aids) if they are needed. These include:  Canes.  Walkers.  Scooters.  Crutches.  Turn on the lights when you go into a dark area. Replace any light bulbs as soon as they burn out.  Set up your furniture so you have a clear path. Avoid moving your furniture around.  If any of your floors are uneven, fix them.  If there are any pets around you, be aware of where they are.  Review your medicines with your doctor. Some medicines can make you feel dizzy. This can increase your chance of falling. Ask your doctor what other things that you can do to help prevent falls. This information is not intended to replace advice given to you by your health care provider. Make sure you discuss any questions you have with your health care provider. Document Released: 02/17/2009 Document Revised: 09/29/2015 Document Reviewed: 05/28/2014 Elsevier Interactive Patient Education  2017 [employer]

## 2016-11-16 ENCOUNTER — Ambulatory Visit (INDEPENDENT_AMBULATORY_CARE_PROVIDER_SITE_OTHER): Payer: Medicare Other | Admitting: Internal Medicine

## 2016-11-16 ENCOUNTER — Encounter (HOSPITAL_COMMUNITY): Payer: Self-pay

## 2016-11-16 ENCOUNTER — Encounter: Payer: Self-pay | Admitting: Internal Medicine

## 2016-11-16 ENCOUNTER — Telehealth: Payer: Self-pay

## 2016-11-16 ENCOUNTER — Emergency Department (HOSPITAL_COMMUNITY)
Admission: EM | Admit: 2016-11-16 | Discharge: 2016-11-16 | Disposition: A | Discharge status: Home / Self Care | Payer: Medicare Other | Attending: Emergency Medicine | Admitting: Emergency Medicine

## 2016-11-16 VITALS — BP 144/82 | HR 60 | Temp 97.9°F | Ht 63.0 in | Wt 174.0 lb

## 2016-11-16 DIAGNOSIS — E119 Type 2 diabetes mellitus without complications: Secondary | ICD-10-CM | POA: Insufficient documentation

## 2016-11-16 DIAGNOSIS — R7303 Prediabetes: Secondary | ICD-10-CM

## 2016-11-16 DIAGNOSIS — R600 Localized edema: Secondary | ICD-10-CM

## 2016-11-16 DIAGNOSIS — I10 Essential (primary) hypertension: Secondary | ICD-10-CM | POA: Diagnosis not present

## 2016-11-16 DIAGNOSIS — Z0000 Encounter for general adult medical examination without abnormal findings: Secondary | ICD-10-CM

## 2016-11-16 DIAGNOSIS — R001 Bradycardia, unspecified: Secondary | ICD-10-CM | POA: Diagnosis not present

## 2016-11-16 DIAGNOSIS — R9431 Abnormal electrocardiogram [ECG] [EKG]: Secondary | ICD-10-CM | POA: Diagnosis not present

## 2016-11-16 DIAGNOSIS — J301 Allergic rhinitis due to pollen: Secondary | ICD-10-CM | POA: Diagnosis not present

## 2016-11-16 DIAGNOSIS — N644 Mastodynia: Secondary | ICD-10-CM

## 2016-11-16 DIAGNOSIS — M2062 Acquired deformities of toe(s), unspecified, left foot: Secondary | ICD-10-CM

## 2016-11-16 NOTE — ED Provider Notes (Signed)
Emergency Department Provider Note   I have reviewed the triage vital signs and the nursing notes.   HISTORY  Chief Complaint Abnormal ECG (sent by Dr. )   HPI Wendy Yang is a 83 y.o. female with PMH of DM and HTN presents to the emergency department for evaluation of abnormal EKG. The patient was visiting her primary care physician for a routine appointment. She was sent home and then called later saying that her EKG was abnormal and she should present to the emergency department. The patient denies any chest pain, difficulty breathing, lightheadedness, syncope, or unusual fatigue. No medication changes or additions. No radiation of pain. No fever or chills. No history of abnormal heart rhythms.   Past Medical History:  Diagnosis Date  . Diabetes mellitus without complication (HCC)   . Hypertension     Patient Active Problem List   Diagnosis Date Noted  . Bradycardia 11/16/2016  . Seasonal allergic rhinitis due to pollen 11/16/2016  . Prediabetes 11/16/2016  . Abnormal ECG 11/16/2016  . Deformity of toe of left foot 11/16/2016  . Breast tenderness in female 11/16/2016  . Right-sided face pain 10/19/2014  . Viral URI with cough 05/11/2014  . Bilateral lower extremity edema 02/09/2014  . Hymenoptera allergy 10/02/2013  . Bilateral hand pain 06/16/2013  . Cerumen impaction 06/16/2013  . Elevated TSH 03/16/2013  . Dizziness and giddiness 03/06/2013  . Pain in right shoulder 03/06/2013  . Vision changes 03/06/2013  . GERD (gastroesophageal reflux disease) 09/17/2012  . Facial droop 05/20/2012  . Radicular pain in right arm 05/05/2012  . Lip swelling 05/05/2012  . Exposure to hepatitis A 05/05/2012  . Nasal congestion with rhinorrhea 12/28/2011  . Hypercholesterolemia 09/26/2011  . Hypertension 08/21/2011    History reviewed. No pertinent surgical history.  Current Outpatient Rx  . Order #: [id number] Class: Historical Med  . Order #: [id number] Class:  Historical Med    Allergies Patient has no known allergies.  Family History  Problem Relation Age of Onset  . Heart disease Mother   . Heart attack Mother   . Cancer Brother     Social History Social History  Substance Use Topics  . Smoking status: Never Smoker  . Smokeless tobacco: Never Used  . Alcohol use No    Review of Systems  Constitutional: No fever/chills Eyes: No visual changes. ENT: No sore throat. Cardiovascular: Denies chest pain. Respiratory: Denies shortness of breath. Gastrointestinal: No abdominal pain.  No nausea, no vomiting.  No diarrhea.  No constipation. Genitourinary: Negative for dysuria. Musculoskeletal: Negative for back pain. Skin: Negative for rash. Neurological: Negative for headaches, focal weakness or numbness.  10-point ROS otherwise negative.  ____________________________________________   PHYSICAL EXAM:  VITAL SIGNS: ED Triage Vitals  Enc Vitals Group     BP 11/16/16 1320 (!) 156/85     Pulse Rate 11/16/16 1320 (!) 58     Resp 11/16/16 1320 17     Temp --      Temp src --      SpO2 11/16/16 1320 100 %     Weight 11/16/16 1320 174 lb (78.9 kg)     Height 11/16/16 1320 5\' 3"  (1.6 m)     Pain Score 11/16/16 1322 0   Constitutional: Alert and oriented. Well appearing and in no acute distress. Eyes: Conjunctivae are normal.  Head: Atraumatic. Nose: No congestion/rhinnorhea. Mouth/Throat: Mucous membranes are moist. Neck: No stridor.  Cardiovascular: Sinus bradycardia. Good peripheral circulation. Grossly normal heart sounds.  Respiratory: Normal respiratory effort.  No retractions. Lungs CTAB. Gastrointestinal: Soft and nontender. No distention.  Musculoskeletal: No lower extremity tenderness nor edema. No gross deformities of extremities. Neurologic:  Normal speech and language. No gross focal neurologic deficits are appreciated.  Skin:  Skin is warm, dry and intact. No rash  noted.  ____________________________________________   LABS (all labs ordered are listed, but only abnormal results are displayed)  None ____________________________________________  EKG   EKG Interpretation  Date/Time:  Friday November 16 2016 13:11:59 EDT Ventricular Rate:  59 PR Interval:    QRS Duration: 143 QT Interval:  486 QTC Calculation: 482 R Axis:   -81 Text Interpretation:  Sinus or ectopic atrial rhythm Prolonged PR interval Nonspecific IVCD with LAD Left ventricular hypertrophy Anterior Q waves, possibly due to LVH No STEMI. Similar to prior.  Confirmed by Alona Bene [phone]) on 11/16/2016 2:07:54 PM      ____________________________________________   PROCEDURES  Procedure(s) performed:   Procedures  None ____________________________________________   INITIAL IMPRESSION / ASSESSMENT AND PLAN / ED COURSE  Pertinent labs & imaging results that were available during my care of the patient were reviewed by me and considered in my medical decision making (see chart for details).  Patient presents to the emergency room in for evaluation of EKG abnormality. In review of her old EKGs the PR prolongation seems similar to prior tracings and consistent with 1st degree AV block. No evidence of acute ischemia on EKG. Patient is asymptomatic. Plan to discuss with Cardiology for confirmation.   02:33 PM Spoke with Dr. Mayford Knife with Cardiology who reviewed the EKG. No acute findings especially in the setting of no cardiac complaints. Advises Cardiology follow up in office. Cancelled lab work with no change in EKG and lack of symptoms.   At this time, I do not feel there is any life-threatening condition present. I have reviewed and discussed all results (EKG, imaging, lab, urine as appropriate), exam findings with patient. I have reviewed nursing notes and appropriate previous records.  I feel the patient is safe to be discharged home without further emergent workup. Discussed  usual and customary return precautions. Patient and family (if present) verbalize understanding and are comfortable with this plan.  Patient will follow-up with their primary care provider. If they do not have a primary care provider, information for follow-up has been provided to them. All questions have been answered.  ____________________________________________  FINAL CLINICAL IMPRESSION(S) / ED DIAGNOSES  Final diagnoses:  Abnormal EKG     MEDICATIONS GIVEN DURING THIS VISIT:  None  NEW OUTPATIENT MEDICATIONS STARTED DURING THIS VISIT:  None   Note:  This document was prepared using Dragon voice recognition software and may include unintentional dictation errors.  Alona Bene, MD Emergency Medicine    Cipriano Millikan, Arlyss Repress, MD 11/16/16 [phone]

## 2016-11-16 NOTE — Progress Notes (Signed)
Patient ID: Wendy Yang, female   DOB: 1925-10-31, 83 y.o.   MRN: [id number]   Location:  [facility]  Place of Service:  OFFICE  Provider: Elmon Kirschner, DO  Patient Care Team: Kirt Boys, DO as PCP - General (Internal Medicine)  Extended Emergency Contact Information Primary Emergency Contact: Solomon,Carol Address: [address]          [place] [address] [place] Home Phone: [phone] Relation: None Secondary Emergency Contact: Adams,Claudine Address: [address]          [place], [place] [address] [place] Home Phone: [phone] Relation: Daughter  Code Status: FULL CODE Goals of Care: Advanced Directive information Advanced Directives 11/16/2016  Does Patient Have a Medical Advance Directive? No  Type of Advance Directive -  Does patient want to make changes to medical advance directive? No - Patient declined  Copy of Healthcare Power of Attorney in Chart? -  Would patient like information on creating a medical advance directive? -     Chief Complaint  Patient presents with  . Annual Exam    Yearly Exam. Need DM Foot Exam    HPI: Patient is a 83 y.o. female seen in today for an annual wellness exam.   AWV note from 11/15/16 reviewed. Pt c/o b/l LE edema. She also has rhinorrhea and nasal sores. She tried OTC antihistamine which worked for a short time. She has not tried nasal steroid but does use topical Rawley ointment in nare which helps.  Labs from 11/12/16 reviewed with pt. TSH elevated at 5.96; Cr 1.17; K 5; Tchol 244; HDL 81; LDL 150  She takes several herbal supplements but cannot recall their names. She has occasional palpitations.   She has noticed her short term memory is worse. MMSE 23/30.   HTN - BP stable (144/82). She does not take any meds. She drinks herbal tea  Postherpetic neuralgia - resolved postherpetic pain in right shoulder and neck. Scalp itching resolved. She is no longer on  gabapentin  Prediabetes - highest A1c 6.5% a few yrs ago. Recent A1c 5.9%. No numbness or tingling in hands/feet   Depression screen [facility] 2/9 11/16/2016 11/15/2016 12/01/2014 10/28/2014 10/19/2014  Decreased Interest 0 0 0 0 0  Down, Depressed, Hopeless 0 0 0 0 0  PHQ - 2 Score 0 0 0 0 0    Fall Risk  11/16/2016 11/15/2016 12/24/2014 12/01/2014 10/28/2014  Falls in the past year? No No No No No   MMSE - Mini Mental State Exam 11/15/2016  Orientation to time 4  Orientation to Place 5  Registration 3  Attention/ Calculation 1  Recall 2  Language- name 2 objects 2  Language- repeat 1  Language- follow 3 step command 2  Language- read & follow direction 1  Write a sentence 1  Copy design 1  Total score 23     Health Maintenance  Topic Date Due  . OPHTHALMOLOGY EXAM  06/28/1937  . URINE MICROALBUMIN  06/28/1937  . FOOT EXAM  02/10/2015  . PNA vac Low Risk Adult (1 of 2 - PCV13) 11/17/2025 (Originally 06/28/1992)  . TETANUS/TDAP  11/19/2029 (Originally 06/28/1946)  . INFLUENZA VACCINE  12/05/2016  . HEMOGLOBIN A1C  05/15/2017  . DEXA SCAN  Completed    Past Medical History:  Diagnosis Date  . Diabetes mellitus without complication (HCC)   . Hypertension     History reviewed. No pertinent surgical history.  Family History  Problem Relation Age of  Onset  . Heart disease Mother   . Heart attack Mother   . Cancer Brother    Family Status  Relation Status  . Mother Deceased at age 81       Heart attack  . Brother Deceased at age 81       Cancer  . Father Deceased  . Brother Deceased at age 81       MVA  . Daughter Alive  . Son Alive  . Brother Deceased at age 81       Liver Failure  . Daughter Deceased  . Daughter Alive  . Daughter Alive  . Son Alive    Social History   Social History  . Marital status: Divorced    Spouse name: N/A  . Number of children: N/A  . Years of education: N/A   Occupational History  . Not on file.   Social History Main Topics  .  Smoking status: Never Smoker  . Smokeless tobacco: Never Used  . Alcohol use No  . Drug use: No  . Sexual activity: Not Currently   Other Topics Concern  . Not on file   Social History Narrative   Diet:    Do you drink/eat things with caffeine? Coffee   Marital status: Divorced                        What year were you married? 1946   Do you live in a house, apartment, assisted living, condo, trailer, etc)? House   Is it one or more stories?  One   How many persons live in your home? 1   Do you have any pets in your home? No   Current or past profession: Food Packer   Do you exercise?  Yes                                                   Type & how often: Walk, yard work    Do you have a living will?    Do you have a DNR Form?   Do you have a POA/HPOA forms?             No Known Allergies  Allergies as of 11/16/2016   No Known Allergies     Medication List       Accurate as of 11/16/16 10:49 AM. Always use your most recent med list.          acetaminophen 325 MG tablet Commonly known as:  TYLENOL Take 325 mg by mouth daily as needed for mild pain or moderate pain.   aspirin EC 81 MG tablet Take 81 mg by mouth daily.   gabapentin 100 MG capsule Commonly known as:  NEURONTIN Take 2 caps po qhs for nerve pain        Review of Systems:  Review of Systems  Unable to perform ROS: Dementia    Physical Exam: Vitals:   11/16/16 1015  BP: (!) 144/82  Pulse: 60  Temp: 97.9 F (36.6 C)  TempSrc: Oral  SpO2: 97%  Weight: 174 lb (78.9 kg)  Height: 5\' 3"  (1.6 m)   Body mass index is 30.82 kg/m. Physical Exam  Constitutional: She appears well-developed and well-nourished. No distress.  HENT:  Head: Normocephalic and atraumatic.  Right Ear: External ear normal.  Left Ear: External ear normal.  Mouth/Throat: Oropharynx is clear and moist. No oropharyngeal exudate.  L>R TM dull but intact; no redness; nares with enlarged grey dry turbinates; oropharynx  cobblestoning but no redness/exudate; MMM; no oral thrush  Eyes: Pupils are equal, round, and reactive to light. EOM are normal. No scleral icterus.  Neck: Normal range of motion. Neck supple. Carotid bruit is not present. No tracheal deviation present. No thyromegaly present.  Cardiovascular: Intact distal pulses.  An irregular rhythm present. Bradycardia present.  Exam reveals no gallop and no friction rub.   Murmur (1/6 SEM) heard. HR @40 ; trace LE edema b/l. No calf TTP  Pulmonary/Chest: Effort normal and breath sounds normal. No respiratory distress. She has no wheezes. She has no rales. She exhibits no tenderness. Right breast exhibits tenderness. Right breast exhibits no inverted nipple, no mass, no nipple discharge and no skin change. Left breast exhibits tenderness. Left breast exhibits no inverted nipple, no mass, no nipple discharge and no skin change. Breasts are symmetrical.  No rhonchi  Abdominal: Soft. Bowel sounds are normal. She exhibits no distension and no mass. There is no hepatosplenomegaly or hepatomegaly. There is no tenderness. There is no rebound and no guarding. No hernia.  Musculoskeletal: She exhibits edema and deformity (left toes - 2nd toe crossing over 1st toe). She exhibits no tenderness.  Lymphadenopathy:    She has no cervical adenopathy.  Neurological: She is alert.  Skin: Skin is warm and dry. No rash noted.  Psychiatric: She has a normal mood and affect. Her behavior is normal.  Vitals reviewed.   Labs reviewed:  Basic Metabolic Panel:  Recent Labs  16/10/96 0852  NA 144  K 5.0  CL 107  CO2 22  GLUCOSE 99  BUN 18  CREATININE 1.17*  CALCIUM 9.4  TSH 5.96*   Liver Function Tests:  Recent Labs  11/12/16 0852  AST 14  ALT 6  ALKPHOS 98  BILITOT 0.3  PROT 6.8  ALBUMIN 4.0   No results for input(s): LIPASE, AMYLASE in the last 8760 hours. No results for input(s): AMMONIA in the last 8760 hours. CBC:  Recent Labs  11/12/16 0852  WBC  5.2  NEUTROABS 2,340  HGB 11.8  HCT 38.0  MCV 95.7  PLT 219   Lipid Panel:  Recent Labs  11/12/16 0852  CHOL 244*  HDL 81  LDLCALC 150*  TRIG 63  CHOLHDL 3.0   Lab Results  Component Value Date   HGBA1C 5.9 (H) 11/12/2016    Procedures: No results found. ECG OBTAINED AND REVIEWED BY MYSELF: Irregular @ 36 bpm, LAD, LAE, RBBB, possible type II Mobitz, poor R wave progression. (+) change since 05/2012  Assessment/Plan   ICD-10-CM   1. Well adult exam Z00.00   2. Bradycardia R00.1 Ambulatory referral to Cardiology    DG Chest 2 View   by auscultation and ECG  3. Seasonal allergic rhinitis due to pollen J30.1   4. Hypertension, unspecified type I10 EKG 12-Lead  5. Bilateral lower extremity edema R60.0 Ambulatory referral to Cardiology    DG Chest 2 View  6. Prediabetes R73.03   7. Abnormal ECG R94.31 Ambulatory referral to Cardiology  8. Deformity of toe of left foot M20.62 Ambulatory referral to Podiatry  9. Breast tenderness in female N64.4 MM Digital Diagnostic Bilat   Will call with cardiology referral - urgent referral placed. S/w provider and pt sent to ED for further eval and management  Will call with xray results  PLEASE BRING ALL MEDICATIONS THAT YOU ARE TAKING TO YOUR NEXT OFFICE VISIT!  Go to the ER if you have chest pain, shortness of breath, palpitations.  Follow up in 1 month for HTN, palpitations, bradycardia  Angelik Walls S. Ancil Linsey  [facility] [address] [place], [place] [address] [phone] Cell (Monday-Friday 8 AM - 5 PM) [phone] After 5 PM and follow prompts

## 2016-11-16 NOTE — ED Notes (Signed)
This nurse attempted for PIV, but was unsuccessful. US IV requested for PIV placement.

## 2016-11-16 NOTE — ED Triage Notes (Signed)
Pt had a physical this morning. Her PCP called her shortly after and instructed her to go to the ED for an abnormal ECG reading. Pt denies any symptoms. No chest pain. No SOB. A&Ox4. Ambulatory.

## 2016-11-16 NOTE — Patient Instructions (Signed)
Will call with cardiology referral  Will call with xray results  PLEASE BRING ALL MEDICATIONS THAT YOU ARE TAKING TO YOUR NEXT OFFICE VISIT!  Go to the ER if you have chest pain, shortness of breath, palpitations.  Follow up in 1 month for HTN, palpitations, bradycardia

## 2016-11-16 NOTE — Discharge Instructions (Signed)
You were seen in the ED today with an abnormal EKG. It looks similar to your prior EKG from 2014. The Cardiologist recommends following up in the Cardiology clinic. Call the number provided to schedule the next available appointment.   Return to the ED with any chest pain, difficulty breathing, racing heartbeat, or passing out.

## 2016-11-16 NOTE — Telephone Encounter (Signed)
Called patients home number no answer. Per patients demographics called patients daughter [name] she stated that she would get in contact with mother and take her to the hospital..

## 2016-11-16 NOTE — Telephone Encounter (Signed)
[facility] [phone] she stated that it is ok for patient to come in and fax EKG to  [phone].

## 2016-11-16 NOTE — Telephone Encounter (Signed)
Spoke with on call cardiologist at [facility] she stated that patient should go to ER to be seen. With the heart rate of 36.

## 2016-11-16 NOTE — Telephone Encounter (Signed)
Called [facility] they are currently closed for an annual meeting. Spoke to after hours reception and requested that she have a provider call us back regarding cardio referral. Pt had abnormal EKG.

## 2016-11-16 NOTE — ED Notes (Signed)
Able to obtrain blood samples with US.

## 2016-11-20 ENCOUNTER — Other Ambulatory Visit: Payer: Self-pay | Admitting: Internal Medicine

## 2016-11-20 DIAGNOSIS — N644 Mastodynia: Secondary | ICD-10-CM

## 2016-11-22 ENCOUNTER — Ambulatory Visit (INDEPENDENT_AMBULATORY_CARE_PROVIDER_SITE_OTHER): Payer: Medicare Other | Admitting: Nurse Practitioner

## 2016-11-22 ENCOUNTER — Encounter: Payer: Self-pay | Admitting: Nurse Practitioner

## 2016-11-22 VITALS — BP 130/82 | HR 63 | Temp 97.7°F | Resp 18 | Ht 63.0 in | Wt 170.4 lb

## 2016-11-22 DIAGNOSIS — R001 Bradycardia, unspecified: Secondary | ICD-10-CM | POA: Diagnosis not present

## 2016-11-22 NOTE — Progress Notes (Signed)
Careteam: Patient Care Team: Kirt Boys, DO as PCP - General (Internal Medicine)  Advanced Directive information Does Patient Have a Medical Advance Directive?: No  No Known Allergies  Chief Complaint  Patient presents with  . Follow-up    Pt is being seen for an ED follow up from 11/16/16 visit due to abnormal EKG.      HPI: Patient is a 83 y.o. female seen in the office today to follow up ED visit. Pt with hx of DM, HTN and memory loss. At last OV noted to have a HR of 40 without symptoms, cardiologist office called and instructed to send pt to hospital for further evaluation due to unable to do a urgent cardiology referral. Cardiologist evaluated EKG in ED and advised cardiology follow up due to no cardiac complaints or symptoms.  Currently doing well. No new symptoms. Has cardiology appt scheduled for Monday of next week.   Review of Systems:  Review of Systems  Constitutional: Negative for chills, fever and weight loss.  HENT: Negative for tinnitus.   Respiratory: Negative for cough, sputum production and shortness of breath.   Cardiovascular: Negative for chest pain, palpitations and leg swelling.  Gastrointestinal: Negative for abdominal pain, constipation, diarrhea and heartburn.  Genitourinary: Negative for dysuria, frequency and urgency.  Musculoskeletal: Negative for back pain, falls, joint pain and myalgias.  Skin: Negative.   Neurological: Negative for dizziness and headaches.  Psychiatric/Behavioral: Negative for depression and memory loss. The patient does not have insomnia.     Past Medical History:  Diagnosis Date  . Diabetes mellitus without complication (HCC)   . Hypertension    History reviewed. No pertinent surgical history. Social History:   reports that she has never smoked. She has never used smokeless tobacco. She reports that she does not drink alcohol or use drugs.  Family History  Problem Relation Age of Onset  . Heart disease Mother     . Heart attack Mother   . Cancer Brother     Medications: Patient's Medications  New Prescriptions   No medications on file  Previous Medications   ACETAMINOPHEN (TYLENOL) 325 MG TABLET    Take 325 mg by mouth daily as needed for mild pain or moderate pain.   ASPIRIN EC 81 MG TABLET    Take 81 mg by mouth daily.  Modified Medications   No medications on file  Discontinued Medications   No medications on file     Physical Exam:  Vitals:   11/22/16 1039  BP: 130/82  Pulse: 63  Resp: 18  Temp: 97.7 F (36.5 C)  TempSrc: Oral  SpO2: 98%  Weight: 170 lb 6.4 oz (77.3 kg)  Height: 5\' 3"  (1.6 m)   Body mass index is 30.19 kg/m.  Physical Exam  Constitutional: She is oriented to person, place, and time. She appears well-developed and well-nourished. No distress.  HENT:  Head: Normocephalic and atraumatic.  Mouth/Throat: No oropharyngeal exudate.  Eyes: Pupils are equal, round, and reactive to light. Conjunctivae are normal.  Neck: Normal range of motion. Neck supple.  Cardiovascular: Normal rate, regular rhythm and normal heart sounds.   Pulmonary/Chest: Effort normal and breath sounds normal.  Musculoskeletal: She exhibits edema (trace).  Neurological: She is alert and oriented to person, place, and time.  Skin: Skin is warm and dry. She is not diaphoretic.  Psychiatric: She has a normal mood and affect.    Labs reviewed: Basic Metabolic Panel:  Recent Labs  91/47/82 0852  NA  144  K 5.0  CL 107  CO2 22  GLUCOSE 99  BUN 18  CREATININE 1.17*  CALCIUM 9.4  TSH 5.96*   Liver Function Tests:  Recent Labs  11/12/16 0852  AST 14  ALT 6  ALKPHOS 98  BILITOT 0.3  PROT 6.8  ALBUMIN 4.0   No results for input(s): LIPASE, AMYLASE in the last 8760 hours. No results for input(s): AMMONIA in the last 8760 hours. CBC:  Recent Labs  11/12/16 0852  WBC 5.2  NEUTROABS 2,340  HGB 11.8  HCT 38.0  MCV 95.7  PLT 219   Lipid Panel:  Recent Labs   11/12/16 0852  CHOL 244*  HDL 81  LDLCALC 150*  TRIG 63  CHOLHDL 3.0   TSH:  Recent Labs  11/12/16 0852  TSH 5.96*   A1C: Lab Results  Component Value Date   HGBA1C 5.9 (H) 11/12/2016     Assessment/Plan 1. Bradycardia - First degree heart block per cardiologist review during hospitalization, has cardiology consult scheduled for 11/26/16. HR 60 today without symptoms.   Janene HarveyJessica K. Biagio BorgEubanks, AGNP  [facility] [phone](Monday-Friday 8 am - 5 pm) [phone] (after hours)

## 2016-11-23 ENCOUNTER — Ambulatory Visit: Admission: RE | Admit: 2016-11-23 | Payer: Medicare Other | Source: Ambulatory Visit

## 2016-11-23 ENCOUNTER — Ambulatory Visit
Admission: RE | Admit: 2016-11-23 | Discharge: 2016-11-23 | Disposition: A | Discharge status: Home / Self Care | Payer: Medicare Other | Source: Ambulatory Visit | Attending: Internal Medicine | Admitting: Internal Medicine

## 2016-11-23 DIAGNOSIS — R928 Other abnormal and inconclusive findings on diagnostic imaging of breast: Secondary | ICD-10-CM | POA: Diagnosis not present

## 2016-11-23 DIAGNOSIS — N644 Mastodynia: Secondary | ICD-10-CM

## 2016-12-03 ENCOUNTER — Ambulatory Visit (INDEPENDENT_AMBULATORY_CARE_PROVIDER_SITE_OTHER): Payer: Medicare Other | Admitting: Cardiovascular Disease

## 2016-12-03 ENCOUNTER — Encounter: Payer: Self-pay | Admitting: Cardiovascular Disease

## 2016-12-03 VITALS — BP 148/78 | HR 51 | Ht 63.0 in | Wt 172.6 lb

## 2016-12-03 DIAGNOSIS — I441 Atrioventricular block, second degree: Secondary | ICD-10-CM | POA: Diagnosis not present

## 2016-12-03 DIAGNOSIS — I452 Bifascicular block: Secondary | ICD-10-CM | POA: Diagnosis not present

## 2016-12-03 DIAGNOSIS — I10 Essential (primary) hypertension: Secondary | ICD-10-CM | POA: Diagnosis not present

## 2016-12-03 DIAGNOSIS — R001 Bradycardia, unspecified: Secondary | ICD-10-CM

## 2016-12-03 HISTORY — DX: Atrioventricular block, second degree: I44.1

## 2016-12-03 HISTORY — DX: Bifascicular block: I45.2

## 2016-12-03 NOTE — Patient Instructions (Addendum)
Medication Instructions:  Your physician recommends that you continue on your current medications as directed. Please refer to the Current Medication list given to you today.  Labwork: none  Testing/Procedures: Your physician has recommended that you wear an event monitor. Event monitors are medical devices that record the heart's electrical activity. Doctors most often us these monitors to diagnose arrhythmias. Arrhythmias are problems with the speed or rhythm of the heartbeat. The monitor is a small, portable device. You can wear one while you do your normal daily activities. This is usually used to diagnose what is causing palpitations/syncope (passing out). 7 DAY  [facility] AT [address]  Follow-Up: As needed with Dr Fara Chuteandolph   You have been referred to ELECTROPHYSIOLOGIST

## 2016-12-03 NOTE — Progress Notes (Signed)
Cardiology Office Note   Date:  12/03/2016   ID:  Wendy Yang, DOB 1925/10/18, MRN [id number]  PCP:  Kirt Boysarter, Monica, DO  Cardiologist:   Chilton Siiffany [place], MD   Chief Complaint  Patient presents with  . New Patient (Initial Visit)      History of Present Illness: Wendy Yang is a 83 y.o. female with hypertension, hyperlipidemia, diabetes, who presents for an evaluation of bradycardia.  Wendy Yang was noted to have a heart rate of 40 when she visited her primary care office. She was referred to the emergency department because no cardiology appointments were available at the time. She was seen in the emergency department and asymptomatic, so outpatient cardiology was recommended.  Her heart rate at that time was In the 40s to upper 50s. EKG showed both sinus bradycardia and Mobitz I second degree AV block.  Wendy Yang feels well.  She notes occasional dizziness upon standing that lasts for a few moments at a time. She denies syncope or palpitations. This is worse when she has discomfort from her hiatal hernia. These episodes are associated with substernal discomfort and shortness of breath. She feels better when she is able to take a deep breath. It is worse when laying down. She denies exertional symptoms. She continues to move her yard and uses her weed eater. She actually feels better after exercising. She has very mild edema that improves with elevation of her legs. She denies orthopnea or PND.     Past Medical History:  Diagnosis Date  . Bifascicular block 12/03/2016  . Diabetes mellitus without complication (HCC)   . Hypertension   . Mobitz I 12/03/2016    No past surgical history on file.   Current Outpatient Prescriptions  Medication Sig Dispense Refill  . acetaminophen (TYLENOL) 325 MG tablet Take 325 mg by mouth daily as needed for mild pain or moderate pain.    Marland Kitchen. aspirin EC 81 MG tablet Take 81 mg by mouth daily.     No current facility-administered  medications for this visit.     Allergies:   Patient has no known allergies.    Social History:  The patient  reports that she has never smoked. She has never used smokeless tobacco. She reports that she does not drink alcohol or use drugs.   Family History:  The patient's family history includes Breast cancer in her cousin, daughter, and maternal aunt; Cancer in her brother; Heart attack in her mother; Heart disease in her mother.    ROS:  Please see the history of present illness.   Otherwise, review of systems are positive for none.   All other systems are reviewed and negative.    PHYSICAL EXAM: VS:  BP (!) 148/78   Pulse (!) 51   Ht 5\' 3"  (1.6 m)   Wt 78.3 kg (172 lb 9.6 oz)   BMI 30.57 kg/m  , BMI Body mass index is 30.57 kg/m. GENERAL:  Well appearing elderly woman in no acute distress HEENT:  Pupils equal round and reactive, fundi not visualized, oral mucosa unremarkable NECK:  No jugular venous distention, waveform within normal limits, carotid upstroke brisk and symmetric, no bruits, no thyromegaly LYMPHATICS:  No cervical adenopathy LUNGS:  Clear to auscultation bilaterally HEART:  Bradycardic.  Frequent skipped beats.  PMI not displaced or sustained,S1 and S2 within normal limits, no S3, no S4, no clicks, no rubs, no murmurs ABD:  Flat, positive bowel sounds normal in frequency in pitch, no  bruits, no rebound, no guarding, no midline pulsatile mass, no hepatomegaly, no splenomegaly EXT:  2 plus pulses throughout, no edema, no cyanosis no clubbing SKIN:  No rashes no nodules NEURO:  Cranial nerves II through XII grossly intact, motor grossly intact throughout PSYCH:  Cognitively intact, oriented to person place and time  EKG:  EKG is ordered today. The ekg ordered today demonstrates sinus bradycardia.  Rate 51 bpm.  RBBB.  LAFB.  Mobitz I.   Recent Labs: 11/12/2016: ALT 6; BUN 18; Creat 1.17; Hemoglobin 11.8; Platelets 219; Potassium 5.0; Sodium 144; TSH 5.96     Lipid Panel    Component Value Date/Time   CHOL 244 (H) 11/12/2016 0852   TRIG 63 11/12/2016 0852   HDL 81 11/12/2016 0852   CHOLHDL 3.0 11/12/2016 0852   VLDL 13 11/12/2016 0852   LDLCALC 150 (H) 11/12/2016 0852   LDLDIRECT 102 (H) 12/28/2011 1051      Wt Readings from Last 3 Encounters:  12/03/16 78.3 kg (172 lb 9.6 oz)  11/22/16 77.3 kg (170 lb 6.4 oz)  11/16/16 78.9 kg (174 lb)      ASSESSMENT AND PLAN:  # Bifascicular block: # Mobitz I second degree heart block: # Bradycardia:  Wendy Yang has bifascicular block and Mobitz 1 second degree AVblock. At times her heart rate is in the 30s. It seems that she is completely asymptomatic and remains quite active. Given her multiple areas of conductive system disease, including infra-Hissian conduction system diease, it is quite possible that she could progress to third degree heart block.  We will get a 7 day event monitor to better assess her heart rates and to determine if there are any significant pauses.  We will refer her to EP for further evaluation and consideration of a PPM if necessary.  # Hypertension: BP is above goal.  Given her bradycardia, we will avoid lowering her BP for now.   Current medicines are reviewed at length with the patient today.  The patient does not have concerns regarding medicines.  The following changes have been made:  no change  Labs/ tests ordered today include:   Orders Placed This Encounter  Procedures  . Ambulatory referral to Cardiac Electrophysiology  . Cardiac event monitor  . EKG 12-Lead     Disposition:   FU with Jeffrey Voth C. Duke Salviaandolph, MD, [facility]     This note was written with the assistance of speech recognition software.  Please excuse any transcriptional errors.  Signed, Maryela Tapper C. Duke Salviaandolph, MD, [facility]  12/03/2016 6:00 PM     [facility]

## 2016-12-05 ENCOUNTER — Ambulatory Visit (INDEPENDENT_AMBULATORY_CARE_PROVIDER_SITE_OTHER): Payer: Medicare Other

## 2016-12-05 DIAGNOSIS — R001 Bradycardia, unspecified: Secondary | ICD-10-CM

## 2016-12-06 NOTE — Telephone Encounter (Signed)
I received a page from [employer] this morning that the patient had an episode of bradycardia with 2:1 AV block during the night.-- Rates in the 30s.  I spoke with Dr. Duke Salviaandolph (pt saw her last week) and updated her regarding this alert. No further action needed to be taken at this time.

## 2016-12-12 ENCOUNTER — Encounter: Payer: Self-pay | Admitting: *Deleted

## 2016-12-12 NOTE — Progress Notes (Signed)
Patient ID: Wendy Yang, female   DOB: [birth year], 83 y.o.   MRN: [id number] Patient brought [employer] verite cardiac event monitor to our office instead of shipping to [employer] in provided prepaid packaging.  Monitor box sealed and place to go out with [employer] on 12/13/16.

## 2016-12-14 ENCOUNTER — Ambulatory Visit (HOSPITAL_COMMUNITY)
Admission: EM | Admit: 2016-12-14 | Discharge: 2016-12-14 | Disposition: A | Discharge status: Home / Self Care | Payer: Medicare Other | Attending: Emergency Medicine | Admitting: Emergency Medicine

## 2016-12-14 ENCOUNTER — Encounter (HOSPITAL_COMMUNITY): Payer: Self-pay | Admitting: *Deleted

## 2016-12-14 DIAGNOSIS — K0889 Other specified disorders of teeth and supporting structures: Secondary | ICD-10-CM

## 2016-12-14 DIAGNOSIS — K047 Periapical abscess without sinus: Secondary | ICD-10-CM

## 2016-12-14 MED ORDER — AMOXICILLIN 500 MG PO CAPS: 1000.0000 mg | ORAL_CAPSULE | Freq: Two times a day (BID) | ORAL | 0 refills | Status: DC

## 2016-12-14 NOTE — Discharge Instructions (Signed)
Continue your topical remedies at home. He may also take ibuprofen for pain. Follow-up with the dentist on Monday or as soon as possible. For any worsening, increased swelling, fever, chills, fever seek medical attention probably.

## 2016-12-14 NOTE — ED Triage Notes (Signed)
Patient states that this am she woke up with left upper gum pain, face noticeably swollen with redness. Patient states she has had a bump on her gums for a year and it has never hurt until today. Unable to make dentist appointment due to being closed today.

## 2016-12-14 NOTE — ED Provider Notes (Signed)
[facility]    CSN: [id number] Arrival date & time: 12/14/16  1420     History   Chief Complaint Chief Complaint  Patient presents with  . Dental Pain    HPI Wendy Yang is a 83 y.o. female.   83 year old female states she has had a static not on the left upper gum for about a year. Yesterday he started to become painful and tender. Overnight and today she developed swelling around the gum and the left face. She has been using topical remedies that do help and has taken ibuprofen with relief. Right now she states she does not have any pain. She tended to call the dentist but cannot find one open today.      Past Medical History:  Diagnosis Date  . Bifascicular block 12/03/2016  . Diabetes mellitus without complication (HCC)   . Hypertension   . Mobitz I 12/03/2016    Patient Active Problem List   Diagnosis Date Noted  . Mobitz I 12/03/2016  . Bifascicular block 12/03/2016  . Bradycardia 11/16/2016  . Seasonal allergic rhinitis due to pollen 11/16/2016  . Prediabetes 11/16/2016  . Abnormal ECG 11/16/2016  . Deformity of toe of left foot 11/16/2016  . Breast tenderness in female 11/16/2016  . Right-sided face pain 10/19/2014  . Viral URI with cough 05/11/2014  . Bilateral lower extremity edema 02/09/2014  . Hymenoptera allergy 10/02/2013  . Bilateral hand pain 06/16/2013  . Cerumen impaction 06/16/2013  . Elevated TSH 03/16/2013  . Dizziness and giddiness 03/06/2013  . Pain in right shoulder 03/06/2013  . Vision changes 03/06/2013  . GERD (gastroesophageal reflux disease) 09/17/2012  . Facial droop 05/20/2012  . Radicular pain in right arm 05/05/2012  . Lip swelling 05/05/2012  . Exposure to hepatitis A 05/05/2012  . Nasal congestion with rhinorrhea 12/28/2011  . Hypercholesterolemia 09/26/2011  . Hypertension 08/21/2011    History reviewed. No pertinent surgical history.  OB History    No data available       Home Medications     Prior to Admission medications   Medication Sig Start Date End Date Taking? Authorizing Provider  aspirin EC 81 MG tablet Take 81 mg by mouth daily.   Yes [provider]  acetaminophen (TYLENOL) 325 MG tablet Take 325 mg by mouth daily as needed for mild pain or moderate pain.    [provider]  amoxicillin (AMOXIL) 500 MG capsule Take 2 capsules (1,000 mg total) by mouth 2 (two) times daily. 12/14/16   Hayden RasmussenMabe, Joanna Hall, NP    Family History Family History  Problem Relation Age of Onset  . Heart disease Mother   . Heart attack Mother   . Cancer Brother   . Breast cancer Daughter   . Breast cancer Maternal Aunt   . Breast cancer Cousin     Social History Social History  Substance Use Topics  . Smoking status: Never Smoker  . Smokeless tobacco: Never Used  . Alcohol use No     Allergies   Patient has no known allergies.   Review of Systems Review of Systems  Constitutional: Negative.   HENT: Positive for dental problem and facial swelling.   Respiratory: Negative.   Gastrointestinal: Negative.   Skin: Negative.   Neurological: Negative.   All other systems reviewed and are negative.    Physical Exam Triage Vital Signs ED Triage Vitals [12/14/16 1455]  Enc Vitals Group     BP (!) 167/89     Pulse  Rate (!) 58     Resp 15     Temp 97.8 F (36.6 C)     Temp Source Oral     SpO2 100 %     Weight      Height      Head Circumference      Peak Flow      Pain Score      Pain Loc      Pain Edu?      Excl. in GC?    No data found.   Updated Vital Signs BP (!) 167/89 (BP Location: Right Arm)   Pulse (!) 58   Temp 97.8 F (36.6 C) (Oral)   Resp 15   SpO2 100%   Visual Acuity Right Eye Distance:   Left Eye Distance:   Bilateral Distance:    Right Eye Near:   Left Eye Near:    Bilateral Near:     Physical Exam  Constitutional: She is oriented to person, place, and time. She appears well-developed and well-nourished. No distress.   HENT:  The upper left bicuspid tooth has swelling and erythema, localized tenderness and scant amount of bleeding. There is mild swelling to the left face opposite the tooth. Gingival erythema and mild local swelling extending into the face as above.  Eyes: EOM are normal.  Neck: Normal range of motion. Neck supple.  Cardiovascular: Normal rate.   Pulmonary/Chest: Effort normal. No respiratory distress.  Musculoskeletal: She exhibits no edema.  Neurological: She is alert and oriented to person, place, and time. She exhibits normal muscle tone.  Skin: Skin is warm and dry.  Psychiatric: She has a normal mood and affect.  Nursing note and vitals reviewed.    UC Treatments / Results  Labs (all labs ordered are listed, but only abnormal results are displayed) Labs Reviewed - No data to display  EKG  EKG Interpretation None       Radiology No results found.  Procedures Procedures (including critical care time)  Medications Ordered in UC Medications - No data to display   Initial Impression / Assessment and Plan / UC Course  I have reviewed the triage vital signs and the nursing notes.  Pertinent labs & imaging results that were available during my care of the patient were reviewed by me and considered in my medical decision making (see chart for details).     Continue your topical remedies at home. He may also take ibuprofen for pain. Follow-up with the dentist on Monday or as soon as possible. For any worsening, increased swelling, fever, chills, fever seek medical attention probably.   Final Clinical Impressions(s) / UC Diagnoses   Final diagnoses:  Pain, dental  Dental infection    New Prescriptions New Prescriptions   AMOXICILLIN (AMOXIL) 500 MG CAPSULE    Take 2 capsules (1,000 mg total) by mouth 2 (two) times daily.     Controlled Substance Prescriptions [place] Controlled Substance Registry consulted? Not Applicable   Hayden Rasmussen, NP 12/14/16 [phone]

## 2016-12-18 ENCOUNTER — Other Ambulatory Visit: Payer: Medicare Other

## 2016-12-18 DIAGNOSIS — R946 Abnormal results of thyroid function studies: Secondary | ICD-10-CM | POA: Diagnosis not present

## 2016-12-18 DIAGNOSIS — E7800 Pure hypercholesterolemia, unspecified: Secondary | ICD-10-CM | POA: Diagnosis not present

## 2016-12-18 DIAGNOSIS — R7989 Other specified abnormal findings of blood chemistry: Secondary | ICD-10-CM

## 2016-12-18 LAB — TSH: TSH: 4.91 mIU/L — ABNORMAL HIGH

## 2016-12-21 ENCOUNTER — Telehealth: Payer: Self-pay | Admitting: *Deleted

## 2016-12-21 NOTE — Telephone Encounter (Signed)
-----   Message from Chilton Si, MD sent at 12/19/2016  5:54 PM EDT ----- Heart rates were very low at times.  This was mostly overnight, probably while sleeping.  Please refer to EP to evaluate.

## 2016-12-21 NOTE — Telephone Encounter (Signed)
Spoke with patient and advised of monitor. Patient stated she was "tired of fooling with these doctors" and declined appointment with EP Will forward to her PCP Kirt Boys so she can discuss further at her office visit 12/25/16

## 2016-12-25 ENCOUNTER — Ambulatory Visit: Payer: Medicare Other | Admitting: Internal Medicine

## 2017-01-01 ENCOUNTER — Ambulatory Visit (INDEPENDENT_AMBULATORY_CARE_PROVIDER_SITE_OTHER): Payer: Medicare Other | Admitting: Cardiology

## 2017-01-01 ENCOUNTER — Encounter: Payer: Self-pay | Admitting: Cardiology

## 2017-01-01 VITALS — BP 166/92 | HR 56 | Ht 63.0 in | Wt 167.0 lb

## 2017-01-01 DIAGNOSIS — I10 Essential (primary) hypertension: Secondary | ICD-10-CM | POA: Diagnosis not present

## 2017-01-01 DIAGNOSIS — I441 Atrioventricular block, second degree: Secondary | ICD-10-CM

## 2017-01-01 NOTE — Patient Instructions (Signed)
Medication Instructions:    Your physician recommends that you continue on your current medications as directed. Please refer to the Current Medication list given to you today.  - If you need a refill on your cardiac medications before your next appointment, please call your pharmacy.   Labwork:  None ordered  Testing/Procedures:  None ordered  Follow-Up:  Your physician recommends that you schedule a follow-up appointment in: 3 months with Dr. Camnitz.  Thank you for choosing [facility]!!   Sherri Price, RN [phone]         

## 2017-01-01 NOTE — Progress Notes (Signed)
Electrophysiology Office Note   Date:  01/01/2017   ID:  Wendy Yang, DOB 1925-06-24, MRN [id number]  PCP:  Kirt Boys, DO  Cardiologist:  Duke Salvia Primary Electrophysiologist:  Arbor Cohen Jorja Loa, MD    Chief Complaint  Patient presents with  . Advice Only    Bradycardia/Mobitz 1     History of Present Illness: Wendy Yang is a 83 y.o. female who is being seen today for the evaluation of bradycardia at the request of Kirt Boys, DO. Presenting today for electrophysiology evaluation. She has a history of hypertension, hyperlipidemia, diabetes. She presented to cardiology clinic with bradycardia. Heart rates were in the 40s when she visited her primary physician. She was referred to the emergency room is no cardiology appointments were available. In emergency room, she was asymptomatic and thus followed up in cardiology clinic. Heart rate at the time was 40s to upper 50s. EKG showed sinus bradycardia and Mobitz 1 AV block. She does have occasional dizziness upon standing. No syncope or palpitations. She denies exertional symptoms. Continues to do yard work. Feels better after exercising.  Today, she denies symptoms of palpitations, chest pain, shortness of breath, orthopnea, PND, lower extremity edema, claudication, dizziness, presyncope, syncope, bleeding, or neurologic sequela. The patient is tolerating medications without difficulties.    Past Medical History:  Diagnosis Date  . Bifascicular block 12/03/2016  . Diabetes mellitus without complication (HCC)   . Hypertension   . Mobitz I 12/03/2016   No past surgical history on file.   Current Outpatient Prescriptions  Medication Sig Dispense Refill  . acetaminophen (TYLENOL) 325 MG tablet Take 325 mg by mouth daily as needed for mild pain or moderate pain.    Marland Kitchen aspirin EC 81 MG tablet Take 81 mg by mouth daily.     No current facility-administered medications for this visit.     Allergies:   Patient has  no known allergies.   Social History:  The patient  reports that she has never smoked. She has never used smokeless tobacco. She reports that she does not drink alcohol or use drugs.   Family History:  The patient's family history includes Breast cancer in her cousin, daughter, and maternal aunt; Cancer in her brother; Heart attack in her mother; Heart disease in her mother.    ROS:  Please see the history of present illness.   Otherwise, review of systems is positive for hearing loss.   All other systems are reviewed and negative.    PHYSICAL EXAM: VS:  BP (!) 166/92   Pulse (!) 56   Ht 5\' 3"  (1.6 m)   Wt 167 lb (75.8 kg)   BMI 29.58 kg/m  , BMI Body mass index is 29.58 kg/m. GEN: Well nourished, well developed, in no acute distress  HEENT: normal  Neck: no JVD, carotid bruits, or masses Cardiac: RRR; no murmurs, rubs, or gallops,no edema  Respiratory:  clear to auscultation bilaterally, normal work of breathing GI: soft, nontender, nondistended, + BS MS: no deformity or atrophy  Skin: warm and dry Neuro:  Strength and sensation are intact Psych: euthymic mood, full affect  EKG:  EKG is not ordered today. Personal review of the ekg ordered 12/03/16 shows sinus rhythm, Mobitz 1 AV block, right bundle branch block, left anterior fascicular block  Recent Labs: 11/12/2016: ALT 6; BUN 18; Creat 1.17; Hemoglobin 11.8; Platelets 219; Potassium 5.0; Sodium 144 12/18/2016: TSH 4.91    Lipid Panel     Component Value Date/Time  CHOL 244 (H) 11/12/2016 0852   TRIG 63 11/12/2016 0852   HDL 81 11/12/2016 0852   CHOLHDL 3.0 11/12/2016 0852   VLDL 13 11/12/2016 0852   LDLCALC 150 (H) 11/12/2016 0852   LDLDIRECT 102 (H) 12/28/2011 1051     Wt Readings from Last 3 Encounters:  01/01/17 167 lb (75.8 kg)  12/03/16 172 lb 9.6 oz (78.3 kg)  11/22/16 170 lb 6.4 oz (77.3 kg)      Other studies Reviewed: Additional studies/ records that were reviewed today include: TTE 06/03/16    Review of the above records today demonstrates:  - Left ventricle: The cavity size was normal. Wall thickness was increased in a pattern of mild LVH. There was mild concentric hypertrophy. Systolic function was normal. The estimated ejection fraction was in the range of 50% to 55%. Wall motion was normal; there were no regional wall motion abnormalities. Doppler parameters are consistent with abnormal left ventricular relaxation (grade 1 diastolic dysfunction). - Aortic valve: Trileaflet; mildly thickened, mildly calcified leaflets. Sclerosis without stenosis. - Mitral valve: Mildly calcified annulus. Mildly thickened leaflets .  7 day monitor 12/18/16 - personally reviewed Predominant rhythm: sinus bradycardia Max heart rate: 116 bpm Min heart rate: 30 bpm  ASSESSMENT AND PLAN:  1.  Mobitz 1 AV block: Heart rate is at times in the 30s. She is asymptomatic in general quite active. Wore a monitor which showed a heart rate that ranged from 30-116 bpm. her EKGs have also shown Mobitz 1 AV block. She has a left anterior fascicular block and right bundle branch block. She has not had episodes of fatigue weakness or shortness of breath. She is able to do all of her yard work including mowing the Therapist, music. At this point she is not interested in a pacemaker. Her conduction system disease may progress in the future and that she may benefit moving forward. We'll see her back in 3 months with an EKG for further evaluation.  2. Hypertension: Blood pressure is elevated today but has been normal in the last few visits. We'll continue to monitor and may need medications in the future.    Current medicines are reviewed at length with the patient today.   The patient does not have concerns regarding her medicines.  The following changes were made today:  none  Labs/ tests ordered today include:  No orders of the defined types were placed in this encounter.    Disposition:   FU with  Trammell Bowden 3 months  Signed, Ruffus Kamaka Jorja Loa, MD  01/01/2017 10:24 AM     [facility] [address] [place] [address] [phone] (office) [phone] (fax)

## 2017-01-09 ENCOUNTER — Ambulatory Visit
Admission: RE | Admit: 2017-01-09 | Discharge: 2017-01-09 | Disposition: A | Discharge status: Home / Self Care | Payer: Medicare Other | Source: Ambulatory Visit | Attending: Internal Medicine | Admitting: Internal Medicine

## 2017-01-09 ENCOUNTER — Ambulatory Visit (INDEPENDENT_AMBULATORY_CARE_PROVIDER_SITE_OTHER): Payer: Medicare Other | Admitting: Internal Medicine

## 2017-01-09 ENCOUNTER — Encounter: Payer: Self-pay | Admitting: Internal Medicine

## 2017-01-09 VITALS — BP 136/84 | HR 64 | Temp 97.6°F | Resp 17 | Ht 63.0 in | Wt 169.4 lb

## 2017-01-09 DIAGNOSIS — M5441 Lumbago with sciatica, right side: Secondary | ICD-10-CM

## 2017-01-09 DIAGNOSIS — M47816 Spondylosis without myelopathy or radiculopathy, lumbar region: Secondary | ICD-10-CM

## 2017-01-09 MED ORDER — TIZANIDINE HCL 4 MG PO TABS: 4.0000 mg | ORAL_TABLET | Freq: Three times a day (TID) | ORAL | 1 refills | Status: DC | PRN

## 2017-01-09 MED ORDER — KETOROLAC TROMETHAMINE 30 MG/ML IJ SOLN
30.0000 mg | Freq: Once | INTRAMUSCULAR | Status: AC
  Administered 2017-01-09: 30 mg via INTRAVENOUS

## 2017-01-09 NOTE — Patient Instructions (Addendum)
TORADOL 60mg  injection given today  START ZANAFLEX 4MG  3 TIMES DAILY AS NEEDED FOR MUSCLE SPASMS  CONTINUE TYLENOL ES 500MG  TAKES 2 TABS 3 TIMES DAILY FOR PAIN  May need steroid taper if xray shows no fracture  May need physical therapy if pain does not improve  Follow up in 1 month for HTN, back pain

## 2017-01-09 NOTE — Progress Notes (Signed)
Patient ID: Wendy Yang, female   DOB: 1925-07-28, 83 y.o.   MRN: [id number]    Location:  [facility] Place of Service: OFFICE  Chief Complaint  Patient presents with  . Acute Visit    Pt is being seen mid-back/right side pain that started last night. No known injury.     HPI:  83 yo female seen today for sudden onset of back pain that started last night. No known trauma. No falls. No heavy lifting or intense cleaning. 3 days ago she noticed RLE felt "warm" while sitting on her porch. She then felt tingling in RLE. No loss of bowel/bladder control. She has hx diet controlled DM. She took 2 tabs ES tylenol prior to bed last night which helped. No issues with ambulation. Pain worse when she tries to get out of chair.  She also notes pain in right thumb. Last xray of L spine completed in 2010 revealed severe DDD at L4-L5 with b/l fact DJD at L3-L4,L4-L5, L5-S1.   She states she was tending her garden heavily on August 31st.  Past Medical History:  Diagnosis Date  . Bifascicular block 12/03/2016  . Diabetes mellitus without complication ([place])   . Hypertension   . Mobitz I 12/03/2016    History reviewed. No pertinent surgical history.  Patient Care Team: Gildardo Cranker, DO as PCP - General (Internal Medicine)  Social History   Social History  . Marital status: Divorced    Spouse name: N/A  . Number of children: N/A  . Years of education: N/A   Occupational History  . Not on file.   Social History Main Topics  . Smoking status: Never Smoker  . Smokeless tobacco: Never Used  . Alcohol use No  . Drug use: No  . Sexual activity: Not Currently   Other Topics Concern  . Not on file   Social History Narrative   Diet:    Do you drink/eat things with caffeine? Coffee   Marital status: Divorced                        What year were you married? 1946   Do you live in a house, apartment, assisted living, condo, trailer, etc)? House   Is it one or more stories?  One   How many  persons live in your home? 1   Do you have any pets in your home? No   Current or past profession: Food Packer   Do you exercise?  Yes                                                   Type & how often: Walk, yard work    Do you have a living will?    Do you have a DNR Form?   Do you have a POA/HPOA forms?              reports that she has never smoked. She has never used smokeless tobacco. She reports that she does not drink alcohol or use drugs.  Family History  Problem Relation Age of Onset  . Heart disease Mother   . Heart attack Mother   . Cancer Brother   . Breast cancer Daughter   . Breast cancer Maternal Aunt   . Breast cancer Cousin    Family Status  Relation Status  . Mother Deceased at age 79       Heart attack  . Brother Deceased at age 41       Cancer  . Father Deceased  . Brother Deceased at age 43       MVA  . Daughter Alive  . Son Alive  . Brother Deceased at age 64       Liver Failure  . Daughter Deceased  . Daughter Alive  . Daughter Alive  . Son Alive  . Mat Aunt (Not Specified)  . Cousin (Not Specified)     No Known Allergies  Medications: Patient's Medications  New Prescriptions   No medications on file  Previous Medications   ACETAMINOPHEN (TYLENOL) 325 MG TABLET    Take 325 mg by mouth daily as needed for mild pain or moderate pain.   ASPIRIN EC 81 MG TABLET    Take 81 mg by mouth daily.  Modified Medications   No medications on file  Discontinued Medications   No medications on file    Review of Systems  Musculoskeletal: Positive for arthralgias and back pain.  Neurological: Positive for numbness.  All other systems reviewed and are negative.   Vitals:   01/09/17 0936  BP: 136/84  Pulse: 64  Resp: 17  Temp: 97.6 F (36.4 C)  TempSrc: Oral  SpO2: 99%  Weight: 169 lb 6.4 oz (76.8 kg)  Height: '5\' 3"'  (1.6 m)   Body mass index is 30.01 kg/m.  Physical Exam  Constitutional: She appears well-developed and  well-nourished.    Musculoskeletal: She exhibits edema and tenderness.  Neg SLR; short right leg; right pelvic outflare  Neurological: She is alert.  Skin: Skin is warm and dry. No rash noted.  Psychiatric: She has a normal mood and affect. Her behavior is normal. Judgment and thought content normal.     Labs reviewed: Appointment on 12/18/2016  Component Date Value Ref Range Status  . TSH 12/18/2016 4.91* mIU/L Final   Comment:   Reference Range   > or = 20 Years  0.40-4.50   Pregnancy Range First trimester  0.26-2.66 Second trimester 0.55-2.73 Third trimester  0.43-2.91     Appointment on 11/12/2016  Component Date Value Ref Range Status  . Sodium 11/12/2016 144  135 - 146 mmol/L Final  . Potassium 11/12/2016 5.0  3.5 - 5.3 mmol/L Final  . Chloride 11/12/2016 107  98 - 110 mmol/L Final  . CO2 11/12/2016 22  20 - 31 mmol/L Final  . Glucose, Bld 11/12/2016 99  65 - 99 mg/dL Final  . BUN 11/12/2016 18  7 - 25 mg/dL Final  . Creat 11/12/2016 1.17* 0.60 - 0.88 mg/dL Final   Comment:   For patients > or = 81 years of age: The upper reference limit for Creatinine is approximately 13% higher for people identified as African-American.     . Total Bilirubin 11/12/2016 0.3  0.2 - 1.2 mg/dL Final  . Alkaline Phosphatase 11/12/2016 98  33 - 130 U/L Final  . AST 11/12/2016 14  10 - 35 U/L Final  . ALT 11/12/2016 6  6 - 29 U/L Final  . Total Protein 11/12/2016 6.8  6.1 - 8.1 g/dL Final  . Albumin 11/12/2016 4.0  3.6 - 5.1 g/dL Final  . Calcium 11/12/2016 9.4  8.6 - 10.4 mg/dL Final  . GFR, Est African American 11/12/2016 48* >=60 mL/min Final  . GFR, Est Non African American 11/12/2016 41* >=60 mL/min Final  .  Cholesterol 11/12/2016 244* <200 mg/dL Final  . Triglycerides 11/12/2016 63  <150 mg/dL Final  . HDL 11/12/2016 81  >50 mg/dL Final  . Total CHOL/HDL Ratio 11/12/2016 3.0  <5.0 Ratio Final  . VLDL 11/12/2016 13  <30 mg/dL Final  . LDL Cholesterol 11/12/2016 150* <100  mg/dL Final  . Hgb A1c MFr Bld 11/12/2016 5.9* <5.7 % Final   Comment:   For someone without known diabetes, a hemoglobin A1c value between 5.7% and 6.4% is consistent with prediabetes and should be confirmed with a follow-up test.   For someone with known diabetes, a value <7% indicates that their diabetes is well controlled. A1c targets should be individualized based on duration of diabetes, age, co-morbid conditions and other considerations.   This assay result is consistent with an increased risk of diabetes.   Currently, no consensus exists regarding use of hemoglobin A1c for diagnosis of diabetes in children.     . Mean Plasma Glucose 11/12/2016 123  mg/dL Final  . TSH 11/12/2016 5.96* mIU/L Final   Comment:   Reference Range   > or = 20 Years  0.40-4.50   Pregnancy Range First trimester  0.26-2.66 Second trimester 0.55-2.73 Third trimester  0.43-2.91     . WBC 11/12/2016 5.2  3.8 - 10.8 K/uL Final  . RBC 11/12/2016 3.97  3.80 - 5.10 MIL/uL Final  . Hemoglobin 11/12/2016 11.8  11.7 - 15.5 g/dL Final  . HCT 11/12/2016 38.0  35.0 - 45.0 % Final  . MCV 11/12/2016 95.7  80.0 - 100.0 fL Final  . MCH 11/12/2016 29.7  27.0 - 33.0 pg Final  . MCHC 11/12/2016 31.1* 32.0 - 36.0 g/dL Final  . RDW 11/12/2016 14.3  11.0 - 15.0 % Final  . Platelets 11/12/2016 219  140 - 400 K/uL Final  . MPV 11/12/2016 11.0  7.5 - 12.5 fL Final  . Neutro Abs 11/12/2016 2340  1,500 - 7,800 cells/uL Final  . Lymphs Abs 11/12/2016 2288  850 - 3,900 cells/uL Final  . Monocytes Absolute 11/12/2016 468  200 - 950 cells/uL Final  . Eosinophils Absolute 11/12/2016 104  15 - 500 cells/uL Final  . Basophils Absolute 11/12/2016 0  0 - 200 cells/uL Final  . Neutrophils Relative % 11/12/2016 45  % Final  . Lymphocytes Relative 11/12/2016 44  % Final  . Monocytes Relative 11/12/2016 9  % Final  . Eosinophils Relative 11/12/2016 2  % Final  . Basophils Relative 11/12/2016 0  % Final  . Smear Review  11/12/2016 Criteria for review not met   Final    No results found.   Assessment/Plan   ICD-10-CM   1. Acute right-sided low back pain with right-sided sciatica M54.41 DG Lumbar Spine Complete    tiZANidine (ZANAFLEX) 4 MG tablet    ketorolac (TORADOL) 30 MG/ML injection 30 mg  2. Spondylosis of lumbar spine M47.816 DG Lumbar Spine Complete    tiZANidine (ZANAFLEX) 4 MG tablet    ketorolac (TORADOL) 30 MG/ML injection 30 mg   T/c PT if no better  TORADOL 76m injection given today  START ZANAFLEX 4MG 3 TIMES DAILY AS NEEDED FOR MUSCLE SPASMS  CONTINUE TYLENOL ES 500MG TAKES 2 TABS 3 TIMES DAILY FOR PAIN  May need steroid taper if xray shows no fracture  May need physical therapy if pain does not improve  Follow up in 1 month for HTN, back pain  Jull Harral S. [facility]Floss[facility]  [facility] [place], [place] [address] [phone] Cell (Monday-Friday 8 AM - 5 PM) [phone] After 5 PM and follow prompts

## 2017-01-23 DIAGNOSIS — H401113 Primary open-angle glaucoma, right eye, severe stage: Secondary | ICD-10-CM | POA: Diagnosis not present

## 2017-02-13 ENCOUNTER — Ambulatory Visit: Payer: Medicare Other | Admitting: Internal Medicine

## 2017-02-26 ENCOUNTER — Ambulatory Visit (INDEPENDENT_AMBULATORY_CARE_PROVIDER_SITE_OTHER): Payer: Medicare Other | Admitting: Internal Medicine

## 2017-02-26 ENCOUNTER — Encounter: Payer: Self-pay | Admitting: Internal Medicine

## 2017-02-26 VITALS — BP 132/80 | HR 65 | Temp 97.6°F | Ht 63.0 in | Wt 167.0 lb

## 2017-02-26 DIAGNOSIS — R7303 Prediabetes: Secondary | ICD-10-CM

## 2017-02-26 DIAGNOSIS — E782 Mixed hyperlipidemia: Secondary | ICD-10-CM | POA: Diagnosis not present

## 2017-02-26 DIAGNOSIS — M47816 Spondylosis without myelopathy or radiculopathy, lumbar region: Secondary | ICD-10-CM

## 2017-02-26 DIAGNOSIS — I10 Essential (primary) hypertension: Secondary | ICD-10-CM

## 2017-02-26 NOTE — Progress Notes (Signed)
Patient ID: Wendy Yang, female   DOB: [birth year], 83 y.o.   MRN: [id number]    Location:  [facility] Place of Service: OFFICE  Chief Complaint  Patient presents with  . Medical Management of Chronic Issues    1 month follow-up on HTN and back pain. Patient never filled rx for Zanaflex and is doing fine. DM foot exam and MALB due (diagnosis of prediabetes, patient not aware)   . Medication Refill    No refills needed   . Immunizations    Refused Flu vaccine, Shingrix, and TDaP     HPI:  83 yo female seen today for f/u back pain. Pain has resolved. She did not get muscle relaxer Rx. Still takes tylenol ES prn but last dose 1 week ago. No numbness/tingling. She has joint pain in hands but improves with joint spray.   HTN - diet controlled  Bradycardia - Mobitz I/bifascicular block. Improved rate. No palpitations.   Past Medical History:  Diagnosis Date  . Bifascicular block 12/03/2016  . Diabetes mellitus without complication ([facility])   . Hypertension   . Mobitz I 12/03/2016    No past surgical history on file.  Patient Care Team: Kirt Boys, DO as PCP - General (Internal Medicine)  Social History   Social History  . Marital status: Divorced    Spouse name: N/A  . Number of children: N/A  . Years of education: N/A   Occupational History  . Not on file.   Social History Main Topics  . Smoking status: Never Smoker  . Smokeless tobacco: Never Used  . Alcohol use No  . Drug use: No  . Sexual activity: Not Currently   Other Topics Concern  . Not on file   Social History Narrative   Diet:    Do you drink/eat things with caffeine? Coffee   Marital status: Divorced                        What year were you married? 1946   Do you live in a house, apartment, assisted living, condo, trailer, etc)? House   Is it one or more stories?  One   How many persons live in your home? 1   Do you have any pets in your home? No   Current or past profession: Food Packer   Do you  exercise?  Yes                                                   Type & how often: Walk, yard work    Do you have a living will?    Do you have a DNR Form?   Do you have a POA/HPOA forms?              reports that she has never smoked. She has never used smokeless tobacco. She reports that she does not drink alcohol or use drugs.  Family History  Problem Relation Age of Onset  . Heart disease Mother   . Heart attack Mother   . Cancer Brother   . Breast cancer Daughter   . Breast cancer Maternal Aunt   . Breast cancer Cousin    Family Status  Relation Status  . Mother Deceased at age 80       Heart attack  . Brother  Deceased at age 72       Cancer  . Father Deceased  . Brother Deceased at age 69       MVA  . Daughter Alive  . Son Alive  . Brother Deceased at age 84       Liver Failure  . Daughter Deceased  . Daughter Alive  . Daughter Alive  . Son Alive  . Mat Aunt (Not Specified)  . Cousin (Not Specified)     No Known Allergies  Medications: Patient's Medications  New Prescriptions   No medications on file  Previous Medications   ACETAMINOPHEN (TYLENOL) 325 MG TABLET    Take 325 mg by mouth daily as needed for mild pain or moderate pain.   ASPIRIN EC 81 MG TABLET    Take 81 mg by mouth daily.  Modified Medications   No medications on file  Discontinued Medications   TIZANIDINE (ZANAFLEX) 4 MG TABLET    Take 1 tablet (4 mg total) by mouth every 8 (eight) hours as needed for muscle spasms.    Review of Systems  Musculoskeletal: Positive for arthralgias.  All other systems reviewed and are negative.   Vitals:   02/26/17 1028  BP: 132/80  Pulse: 65  Temp: 97.6 F (36.4 C)  TempSrc: Oral  SpO2: 90%  Weight: 167 lb (75.8 kg)  Height: 5\' 3"  (1.6 m)   Body mass index is 29.58 kg/m.  Physical Exam  Constitutional: She is oriented to person, place, and time. She appears well-developed and well-nourished.  HENT:  Mouth/Throat: Oropharynx is clear  and moist. No oropharyngeal exudate.  MMM; no oral thrush  Eyes: Pupils are equal, round, and reactive to light. No scleral icterus.  Neck: Neck supple. Carotid bruit is not present. No tracheal deviation present. No thyromegaly present.  Cardiovascular: Normal rate, regular rhythm and intact distal pulses.  Exam reveals no gallop and no friction rub.   Murmur (1/6 SEM) heard. No LE edema b/l. no calf TTP.   Pulmonary/Chest: Effort normal and breath sounds normal. No stridor. No respiratory distress. She has no wheezes. She has no rales.  Abdominal: Soft. Normal appearance and bowel sounds are normal. She exhibits no distension and no mass. There is no hepatomegaly. There is no tenderness. There is no rigidity, no rebound and no guarding. No hernia.  Musculoskeletal: She exhibits edema.  Lymphadenopathy:    She has no cervical adenopathy.  Neurological: She is alert and oriented to person, place, and time.  Skin: Skin is warm and dry. No rash noted.  Psychiatric: She has a normal mood and affect. Her behavior is normal. Judgment and thought content normal.     Labs reviewed: Appointment on 12/18/2016  Component Date Value Ref Range Status  . TSH 12/18/2016 4.91* mIU/L Final   Comment:   Reference Range   > or = 20 Years  0.40-4.50   Pregnancy Range First trimester  0.26-2.66 Second trimester 0.55-2.73 Third trimester  0.43-2.91       No results found.   Assessment/Plan   ICD-10-CM   1. Essential hypertension I10   2. Mixed hyperlipidemia E78.2 Lipid Panel  3. Prediabetes R73.03 Hemoglobin A1c  4. Spondylosis of lumbar spine M47.816    Continue current medications as ordered  Will call with lab results  Follow up in 3 mos HTN, hyperlipidemia, prediabetes   Dula Havlik S. Ancil Linsey  [facility] [address] [place], [place] [address] (  [phone] Cell (Monday-Friday 8 AM - 5 PM) [phone] After 5 PM and follow  prompts

## 2017-02-26 NOTE — Patient Instructions (Signed)
Continue current medications as ordered  Will call with lab results  Follow up in 3 mos HTN, hyperlipidemia, prediabetes

## 2017-02-27 LAB — LIPID PANEL
CHOL/HDL RATIO: 3 (calc) (ref <5.0)
CHOLESTEROL: 249 mg/dL — AB (ref <200)
HDL: 83 mg/dL (ref >50)
LDL CHOLESTEROL (CALC): 146 mg/dL — AB
Non-HDL Cholesterol (Calc): 166 mg/dL (calc) — ABNORMAL HIGH (ref <130)
Triglycerides: 90 mg/dL (ref <150)

## 2017-02-27 LAB — HEMOGLOBIN A1C
EAG (MMOL/L): 6.6 (calc)
Hgb A1c MFr Bld: 5.8 % of total Hgb — ABNORMAL HIGH (ref <5.7)
MEAN PLASMA GLUCOSE: 120 (calc)

## 2017-03-18 ENCOUNTER — Encounter: Payer: Self-pay | Admitting: Internal Medicine

## 2017-04-02 ENCOUNTER — Encounter: Payer: Self-pay | Admitting: Cardiology

## 2017-04-02 ENCOUNTER — Ambulatory Visit: Payer: Medicare Other | Admitting: Cardiology

## 2017-04-02 VITALS — BP 178/92 | HR 59 | Ht 63.0 in | Wt 167.2 lb

## 2017-04-02 DIAGNOSIS — I441 Atrioventricular block, second degree: Secondary | ICD-10-CM | POA: Diagnosis not present

## 2017-04-02 DIAGNOSIS — I10 Essential (primary) hypertension: Secondary | ICD-10-CM

## 2017-04-02 NOTE — Patient Instructions (Addendum)
Medication Instructions:  Your physician recommends that you continue on your current medications as directed. Please refer to the Current Medication list given to you today.  * If you need a refill on your cardiac medications before your next appointment, please call your pharmacy. *  Labwork: None ordered  Testing/Procedures: None ordered  Follow-Up: Your physician wants you to follow-up in: 6 months with Dr. Elberta Fortisamnitz.  You will receive a reminder letter in the mail two months in advance. If you don't receive a letter, please call our office to schedule the follow-up appointment.  Thank you for choosing [facility]!!   Dory HornSherri Madyn Ivins, RN [phone]  Any Other Special Instructions Will Be Listed Below (If Applicable). Please monitor your blood pressures at home over the next several weeks.  Nurse will call you in several weeks to go over those blood pressure readings.

## 2017-04-02 NOTE — Progress Notes (Signed)
Electrophysiology Office Note   Date:  04/02/2017   ID:  Wendy BankerMargaret H Yang, DOB Mar 18, 1926, MRN [id number]  PCP:  Kirt Boysarter, Monica, DO  Cardiologist:  Duke Salviaandolph Primary Electrophysiologist:  Faiga Stones Jorja LoaMartin Renee Beale, MD    Chief Complaint  Patient presents with  . Follow-up    Mobitz 1     History of Present Illness: Wendy Yang is a 83 y.o. female who is being seen today for the evaluation of bradycardia at the request of Kirt Boysarter, Monica, DO. Presenting today for electrophysiology evaluation. She has a history of hypertension, hyperlipidemia, diabetes. She presented to cardiology clinic with bradycardia. Heart rates were in the 40s when she visited her primary physician. She was referred to the emergency room is no cardiology appointments were available. In emergency room, she was asymptomatic and thus followed up in cardiology clinic. Heart rate at the time was 40s to upper 50s. EKG showed sinus bradycardia and Mobitz 1 AV block. She does have occasional dizziness upon standing. No syncope or palpitations. She denies exertional symptoms. Continues to do yard work. Feels better after exercising.  Today, denies symptoms of palpitations, chest pain, shortness of breath, orthopnea, PND, lower extremity edema, claudication, dizziness, presyncope, syncope, bleeding, or neurologic sequela. The patient is tolerating medications without difficulties.  She is currently feeling well.  She has no symptoms of weakness fatigue or shortness of breath.  She is unaware of slow heart rates.   Past Medical History:  Diagnosis Date  . Bifascicular block 12/03/2016  . Diabetes mellitus without complication ([facility])   . Hypertension   . Mobitz I 12/03/2016   No past surgical history on file.   Current Outpatient Medications  Medication Sig Dispense Refill  . acetaminophen (TYLENOL) 325 MG tablet Take 325 mg by mouth daily as needed for mild pain or moderate pain.    Marland Kitchen. aspirin EC 81 MG tablet Take 81 mg by  mouth daily.     No current facility-administered medications for this visit.     Allergies:   Patient has no known allergies.   Social History:  The patient  reports that  has never smoked. she has never used smokeless tobacco. She reports that she does not drink alcohol or use drugs.   Family History:  The patient's family history includes Breast cancer in her cousin, daughter, and maternal aunt; Cancer in her brother; Heart attack in her mother; Heart disease in her mother.    ROS:  Please see the history of present illness.   Otherwise, review of systems is positive for leg pain, joint swelling.   All other systems are reviewed and negative.   PHYSICAL EXAM: VS:  BP (!) 178/92   Pulse (!) 59   Ht 5\' 3"  (1.6 m)   Wt 167 lb 3.2 oz (75.8 kg)   BMI 29.62 kg/m  , BMI Body mass index is 29.62 kg/m. GEN: Well nourished, well developed, in no acute distress  HEENT: normal  Neck: no JVD, carotid bruits, or masses Cardiac: RRR; no murmurs, rubs, or gallops,no edema  Respiratory:  clear to auscultation bilaterally, normal work of breathing GI: soft, nontender, nondistended, + BS MS: no deformity or atrophy  Skin: warm and dry Neuro:  Strength and sensation are intact Psych: euthymic mood, full affect  EKG:  EKG is ordered today. Personal review of the ekg ordered shows SR, rate 59, 1 degree AV block, RBBB, LAFB  Recent Labs: 11/12/2016: ALT 6; BUN 18; Creat 1.17; Hemoglobin 11.8; Platelets 219; Potassium  5.0; Sodium 144 12/18/2016: TSH 4.91    Lipid Panel     Component Value Date/Time   CHOL 249 (H) 02/26/2017 1108   TRIG 90 02/26/2017 1108   HDL 83 02/26/2017 1108   CHOLHDL 3.0 02/26/2017 1108   VLDL 13 11/12/2016 0852   LDLCALC 150 (H) 11/12/2016 0852   LDLDIRECT 102 (H) 12/28/2011 1051     Wt Readings from Last 3 Encounters:  04/02/17 167 lb 3.2 oz (75.8 kg)  02/26/17 167 lb (75.8 kg)  01/09/17 169 lb 6.4 oz (76.8 kg)      Other studies Reviewed: Additional  studies/ records that were reviewed today include: TTE 06/03/16  Review of the above records today demonstrates:  - Left ventricle: The cavity size was normal. Wall thickness was increased in a pattern of mild LVH. There was mild concentric hypertrophy. Systolic function was normal. The estimated ejection fraction was in the range of 50% to 55%. Wall motion was normal; there were no regional wall motion abnormalities. Doppler parameters are consistent with abnormal left ventricular relaxation (grade 1 diastolic dysfunction). - Aortic valve: Trileaflet; mildly thickened, mildly calcified leaflets. Sclerosis without stenosis. - Mitral valve: Mildly calcified annulus. Mildly thickened leaflets .  7 day monitor 12/18/16 - personally reviewed Predominant rhythm: sinus bradycardia Max heart rate: 116 bpm Min heart rate: 30 bpm  ASSESSMENT AND PLAN:  1.  Mobitz 1 AV block: Currently in sinus rhythm with a first-degree AV block, right bundle branch block, left anterior fascicular block.  She has not had any signs or symptoms of bradycardia including fatigue weakness or shortness of breath.  We Hamish Banks continue with current management.  I Lajoya Dombek see her back in 6 months with an EKG.  2. Hypertension: Blood pressure is elevated but has been normal in the past.  Recheck it was also elevated.  She Sera Hitsman check her blood pressures at home and Cristi Gwynn call us back for further determination if she needs medical management.    Current medicines are reviewed at length with the patient today.   The patient does not have concerns regarding her medicines.  The following changes were made today:  none  Labs/ tests ordered today include:  Orders Placed This Encounter  Procedures  . EKG 12-Lead     Disposition:   FU with [employer] [facility] 6 months  Signed, Sadira Standard Jorja LoaMartin Rocket Gunderson, MD  04/02/2017 10:09 AM     [facility] [address] [place] [address] [phone]  (office) [phone] (fax)

## 2017-04-18 ENCOUNTER — Telehealth: Payer: Self-pay | Admitting: *Deleted

## 2017-04-18 NOTE — Telephone Encounter (Signed)
Called to f/u on home BP readings.   Pt reports they are still elevated and systolic is averaging 160/170s. Pt understands I will discuss w/ Dr. Elberta Fortisamnitz and call her today/tomorrow w/ recommendation/s.

## 2017-04-19 MED ORDER — AMLODIPINE BESYLATE 5 MG PO TABS: 5.0000 mg | ORAL_TABLET | Freq: Every day | ORAL | 3 refills | Status: DC

## 2017-04-19 NOTE — Telephone Encounter (Signed)
Advised to start Norvasc 5mg  daily. Pt aware I will f/u in several weeks to see how she is doing. Patient verbalized understanding and agreeable to plan.

## 2017-04-24 DIAGNOSIS — H401113 Primary open-angle glaucoma, right eye, severe stage: Secondary | ICD-10-CM | POA: Diagnosis not present

## 2017-05-02 NOTE — Telephone Encounter (Signed)
Followed up with pt on how her BPs were doing since starting Amlodipine. Pt states they are still up, but she isn't sure exactly the numbers.  States she does not have the numbers on her.  She also reports not taking it recently d/t the holidays. Advised pt to take over the next week or so.  Advised I would follow up next week to get BP numbers from her so that we may determine if therapy is working or if we need to re-address plan of care for better BP control. Patient verbalized understanding and agreeable to plan.

## 2017-05-08 ENCOUNTER — Telehealth: Payer: Self-pay | Admitting: Cardiology

## 2017-05-08 NOTE — Telephone Encounter (Signed)
Pt c/o medication issue:  1. Name of Medication: amlodipine   2. How are you currently taking this medication (dosage and times per day)? 5 mg and 1x daily  3. Are you having a reaction (difficulty breathing--STAT)?   4. What is your medication issue? Patient calling, states that she has been feeling light-headed and thinks it may be the medication.  Patient will be available until 2:30 pm.

## 2017-05-08 NOTE — Telephone Encounter (Signed)
Patient calling and states that yesterday morning when she woke up she felt SOB and lightheaded. Patient states that she feels fine today and does not have any complaints. Patient states that she is not sure if it was the amlodipine that caused her to feel that way or if it was some bad news that she recently received. Patient states that she has been taking amlodipine 5 mg at night. She states that she did not take it last night. Patient states that her BP yesterday was 129/70 and today is 142/70. Made patient aware that she should continue to monitor her BP and the information would be sent to Dr. Elberta Fortisamnitz and his RN for review and recommendation. Patient verbalized understanding and thanked me for the call.

## 2017-05-09 NOTE — Telephone Encounter (Signed)
Followed up w/ pt who tells me she is continuing to feel good, no SOB/light-headedness.  Pt will continue to monitor and call back if issues arises again.  She will also continue to monitor her BP and call if systolic remains > 140s. She appreciates the follow up

## 2017-05-28 ENCOUNTER — Ambulatory Visit (INDEPENDENT_AMBULATORY_CARE_PROVIDER_SITE_OTHER): Payer: Medicare Other | Admitting: Internal Medicine

## 2017-05-28 ENCOUNTER — Encounter: Payer: Self-pay | Admitting: Internal Medicine

## 2017-05-28 VITALS — BP 120/72 | HR 62 | Temp 97.4°F | Resp 20 | Ht 63.0 in | Wt 161.2 lb

## 2017-05-28 DIAGNOSIS — R7303 Prediabetes: Secondary | ICD-10-CM | POA: Diagnosis not present

## 2017-05-28 DIAGNOSIS — K219 Gastro-esophageal reflux disease without esophagitis: Secondary | ICD-10-CM

## 2017-05-28 DIAGNOSIS — E782 Mixed hyperlipidemia: Secondary | ICD-10-CM

## 2017-05-28 DIAGNOSIS — R1310 Dysphagia, unspecified: Secondary | ICD-10-CM

## 2017-05-28 DIAGNOSIS — I10 Essential (primary) hypertension: Secondary | ICD-10-CM | POA: Diagnosis not present

## 2017-05-28 DIAGNOSIS — M47816 Spondylosis without myelopathy or radiculopathy, lumbar region: Secondary | ICD-10-CM

## 2017-05-28 DIAGNOSIS — J301 Allergic rhinitis due to pollen: Secondary | ICD-10-CM

## 2017-05-28 NOTE — Patient Instructions (Addendum)
May need acid reflux pill to help with acid reflux symptoms - may consider omeprazole, protonix (pantoprazole) or prevacid  AVOID ZANTAC DUE TO INCREASED RISK OF HEART RHYTHM PROBLEMS  Continue other medications as ordered  Follow up with specialists as scheduled  Will call with barium swallow appt  Follow up in 3 mos for HTN, hyperlipidemia, dysphagia, prediabetes

## 2017-05-28 NOTE — Progress Notes (Signed)
Patient ID: Wendy Yang, female   DOB: 24-Mar-1926, 84 y.o. MRN: [id number]   Location:  [facility] OFFICE  Provider: DR Arletha Grippe  Code Status:  Goals of Care:  Advanced Directives 01/09/2017  Does Patient Have a Medical Advance Directive? No  Type of Advance Directive -  Does patient want to make changes to medical advance directive? -  Copy of [place] in Chart? -  Would patient like information on creating a medical advance directive? -     Chief Complaint  Patient presents with  . Medical Management of Chronic Issues    3 mo f/u -HTN, Hyperlipidemia,     HPI: Patient is a 84 y.o. female seen today for medical management of chronic diseases.  She had 2 teeth pulled in Sept 2018 that impacted her ability to chew on side. She chews "with front teeth". She has lost 6 lbs since last ov unintentionally. She has exacerbation of HH. She is unable to taste "anything". She continues to have issues with clear rhinorrhea.  HTN - stable on amlodipine. Takes ASA daily  Bradycardia - Mobitz I/bifascicular block. Improved rate. No palpitations. Followed by cardio Dr Curt Bears  Prediabetes - stable. A1c 5.8%  Hyperlipidemia - diet controlled. LDL 146; Tchol 249  hiatal hernia - she reports frequent heartburn and she keeps "swallowing and swallowing" which helps. She has not had a recent GI w/u.   Past Medical History:  Diagnosis Date  . Bifascicular block 12/03/2016  . Diabetes mellitus without complication ([place])   . Hypertension   . Mobitz I 12/03/2016    No past surgical history on file.   reports that  has never smoked. she has never used smokeless tobacco. She reports that she does not drink alcohol or use drugs. Social History   Socioeconomic History  . Marital status: Divorced    Spouse name: Not on file  . Number of children: Not on file  . Years of education: Not on file  . Highest education level: Not on file  Social Needs  . Financial resource  strain: Not on file  . Food insecurity - worry: Not on file  . Food insecurity - inability: Not on file  . Transportation needs - medical: Not on file  . Transportation needs - non-medical: Not on file  Occupational History  . Not on file  Tobacco Use  . Smoking status: Never Smoker  . Smokeless tobacco: Never Used  Substance and Sexual Activity  . Alcohol use: No    Alcohol/week: 0.0 oz  . Drug use: No  . Sexual activity: Not Currently  Other Topics Concern  . Not on file  Social History Narrative   Diet:    Do you drink/eat things with caffeine? Coffee   Marital status: Divorced                        What year were you married? 1946   Do you live in a house, apartment, assisted living, condo, trailer, etc)? House   Is it one or more stories?  One   How many persons live in your home? 1   Do you have any pets in your home? No   Current or past profession: Food Packer   Do you exercise?  Yes  Type & how often: Walk, yard work    Do you have a living will?    Do you have a DNR Form?   Do you have a POA/HPOA forms?          Family History  Problem Relation Age of Onset  . Heart disease Mother   . Heart attack Mother   . Cancer Brother   . Breast cancer Daughter   . Breast cancer Maternal Aunt   . Breast cancer Cousin     No Known Allergies  Outpatient Encounter Medications as of 05/28/2017  Medication Sig  . acetaminophen (TYLENOL) 325 MG tablet Take 325 mg by mouth daily as needed for mild pain or moderate pain.  Marland Kitchen amLODipine (NORVASC) 5 MG tablet Take 1 tablet (5 mg total) by mouth daily.  Marland Kitchen aspirin EC 81 MG tablet Take 81 mg by mouth daily.   No facility-administered encounter medications on file as of 05/28/2017.     Review of Systems:  Review of Systems  Constitutional: Positive for appetite change and unexpected weight change.  HENT: Positive for dental problem.   All other systems reviewed and are  negative.   Health Maintenance  Topic Date Due  . URINE MICROALBUMIN  06/28/1937  . FOOT EXAM  02/10/2015  . INFLUENZA VACCINE  05/08/2023 (Originally 12/05/2016)  . PNA vac Low Risk Adult (1 of 2 - PCV13) 11/17/2025 (Originally 06/28/1992)  . TETANUS/TDAP  11/19/2029 (Originally 06/28/1946)  . HEMOGLOBIN A1C  08/27/2017  . OPHTHALMOLOGY EXAM  10/23/2017  . DEXA SCAN  Completed    Physical Exam: Vitals:   05/28/17 1046  BP: 120/72  Pulse: 62  Resp: 20  Temp: (!) 97.4 F (36.3 C)  SpO2: 94%  Weight: 161 lb 3.2 oz (73.1 kg)  Height: _0  (1.6 m)   Body mass index is 28.56 kg/m. Physical Exam  Constitutional: She is oriented to person, place, and time. She appears well-developed and well-nourished.  HENT:  Mouth/Throat: Oropharynx is clear and moist. No oropharyngeal exudate.  MMM; no oral thrush; several molar teeth missing  Eyes: Pupils are equal, round, and reactive to light. No scleral icterus.  Neck: Neck supple. Carotid bruit is not present. No tracheal deviation present. No thyromegaly present.  Cardiovascular: Normal rate, regular rhythm and intact distal pulses. Exam reveals no gallop and no friction rub.  Murmur (1/6 SEM) heard. No LE edema b/l. no calf TTP.   Pulmonary/Chest: Effort normal and breath sounds normal. No stridor. No respiratory distress. She has no wheezes. She has no rales.  Abdominal: Soft. Normal appearance and bowel sounds are normal. She exhibits distension. She exhibits no mass. There is no hepatomegaly. There is no tenderness. There is no rigidity, no rebound and no guarding. No hernia.  Musculoskeletal: She exhibits edema (left ankle).  Lymphadenopathy:    She has no cervical adenopathy.  Neurological: She is alert and oriented to person, place, and time. She has normal reflexes.  Skin: Skin is warm and dry. No rash noted.  Psychiatric: She has a normal mood and affect. Her behavior is normal. Judgment and thought content normal.    Labs  reviewed: Basic Metabolic Panel: Recent Labs    11/12/16 0852 12/18/16 0950  NA 144  --   K 5.0  --   CL 107  --   CO2 22  --   GLUCOSE 99  --   BUN 18  --   CREATININE 1.17*  --   CALCIUM 9.4  --  TSH 5.96* 4.91*   Liver Function Tests: Recent Labs    11/12/16 0852  AST 14  ALT 6  ALKPHOS 98  BILITOT 0.3  PROT 6.8  ALBUMIN 4.0   No results for input(s): LIPASE, AMYLASE in the last 8760 hours. No results for input(s): AMMONIA in the last 8760 hours. CBC: Recent Labs    11/12/16 0852  WBC 5.2  NEUTROABS 2,340  HGB 11.8  HCT 38.0  MCV 95.7  PLT 219   Lipid Panel: Recent Labs    11/12/16 0852 02/26/17 1108  CHOL 244* 249*  HDL 81 83  LDLCALC 150*  --   TRIG 63 90  CHOLHDL 3.0 3.0   Lab Results  Component Value Date   HGBA1C 5.8 (H) 02/26/2017    Procedures since last visit: No results found.  Assessment/Plan   ICD-10-CM   1. Dysphagia, unspecified type R13.10 SLP modified barium swallow  2. Seasonal allergic rhinitis due to pollen J30.1   3. Essential hypertension I10 BMP with eGFR([facility])  4. Prediabetes R73.03 BMP with eGFR([facility])    Hemoglobin A1c  5. Mixed hyperlipidemia E78.2 Lipid Panel  6. Spondylosis of lumbar spine M47.816   7. Gastroesophageal reflux disease without esophagitis K21.9 SLP modified barium swallow     May need acid reflux pill to help with acid reflux symptoms - may consider omeprazole, protonix (pantoprazole) or prevacid  AVOID ZANTAC DUE TO INCREASED RISK OF HEART RHYTHM PROBLEMS  Continue other medications as ordered  Follow up with specialists as scheduled  Will call with barium swallow appt  Follow up in 3 mos for HTN, hyperlipidemia, dysphagia, prediabetes   Della Scrivener S. Perlie Gold  [facility] [address] [place], [place] [address] [phone] Cell (Monday-Friday 8 AM - 5 PM) [phone] After 5 PM and follow prompts

## 2017-05-29 LAB — BASIC METABOLIC PANEL WITH GFR
BUN/Creatinine Ratio: 13 (calc) (ref 6–22)
BUN: 15 mg/dL (ref 7–25)
CALCIUM: 9.9 mg/dL (ref 8.6–10.4)
CO2: 28 mmol/L (ref 20–32)
CREATININE: 1.12 mg/dL — AB (ref 0.60–0.88)
Chloride: 108 mmol/L (ref 98–110)
GFR, Est African American: 50 mL/min/{1.73_m2} — ABNORMAL LOW (ref >=60)
GFR, Est Non African American: 44 mL/min/{1.73_m2} — ABNORMAL LOW (ref >=60)
GLUCOSE: 109 mg/dL — AB (ref 65–99)
Potassium: 4.7 mmol/L (ref 3.5–5.3)
Sodium: 143 mmol/L (ref 135–146)

## 2017-05-29 LAB — LIPID PANEL
CHOLESTEROL: 284 mg/dL — AB (ref <200)
HDL: 96 mg/dL (ref >50)
LDL CHOLESTEROL (CALC): 171 mg/dL — AB
Non-HDL Cholesterol (Calc): 188 mg/dL (calc) — ABNORMAL HIGH (ref <130)
TRIGLYCERIDES: 71 mg/dL (ref <150)
Total CHOL/HDL Ratio: 3 (calc) (ref <5.0)

## 2017-05-29 LAB — HEMOGLOBIN A1C
Hgb A1c MFr Bld: 6 % of total Hgb — ABNORMAL HIGH (ref <5.7)
Mean Plasma Glucose: 126 (calc)
eAG (mmol/L): 7 (calc)

## 2017-05-31 ENCOUNTER — Other Ambulatory Visit (HOSPITAL_COMMUNITY): Payer: Self-pay | Admitting: Internal Medicine

## 2017-05-31 ENCOUNTER — Telehealth: Payer: Self-pay

## 2017-05-31 ENCOUNTER — Encounter: Payer: Self-pay | Admitting: Internal Medicine

## 2017-05-31 DIAGNOSIS — R1319 Other dysphagia: Secondary | ICD-10-CM

## 2017-05-31 DIAGNOSIS — E7800 Pure hypercholesterolemia, unspecified: Secondary | ICD-10-CM

## 2017-05-31 MED ORDER — ATORVASTATIN CALCIUM 10 MG PO TABS: 10.0000 mg | ORAL_TABLET | Freq: Every day | ORAL | 6 refills | Status: DC

## 2017-05-31 NOTE — Telephone Encounter (Signed)
Medication list has been updated and prescription has been sent to the pharmacy. Repeat lipid and ALT have been ordered

## 2017-05-31 NOTE — Telephone Encounter (Signed)
-----   Message from [place], [place] sent at 05/31/2017  3:27 PM EST ----- lipitor 10mg  #30 take 1 tab po qhs with 6RF; reck fasting lipid panel and ALT in 1 month

## 2017-06-04 ENCOUNTER — Other Ambulatory Visit: Payer: Self-pay | Admitting: Internal Medicine

## 2017-06-04 ENCOUNTER — Ambulatory Visit (HOSPITAL_COMMUNITY)
Admission: RE | Admit: 2017-06-04 | Discharge: 2017-06-04 | Disposition: A | Discharge status: Home / Self Care | Payer: Medicare Other | Source: Ambulatory Visit | Attending: Internal Medicine | Admitting: Internal Medicine

## 2017-06-04 DIAGNOSIS — I700 Atherosclerosis of aorta: Secondary | ICD-10-CM | POA: Insufficient documentation

## 2017-06-04 DIAGNOSIS — R1310 Dysphagia, unspecified: Secondary | ICD-10-CM | POA: Insufficient documentation

## 2017-06-04 DIAGNOSIS — K219 Gastro-esophageal reflux disease without esophagitis: Secondary | ICD-10-CM | POA: Diagnosis not present

## 2017-06-04 DIAGNOSIS — R1319 Other dysphagia: Secondary | ICD-10-CM

## 2017-06-04 NOTE — Progress Notes (Signed)
Modified Barium Swallow Progress Note  Patient Details  Name: Wendy BankerMargaret H Yang MRN: [id number] Date of Birth: 03-16-1926  Today's Date: 06/04/2017  Modified Barium Swallow completed.  Full report located under Chart Review in the Imaging Section.  Brief recommendations include the following:  Clinical Impression    Pt presents age related changes in swallowing and mildly reduced UES opening with mild risk of aspiration. Pt observed with x1 flash penetration during the swallow with thin liquids, which was likely related to age related changes in timing of swallow with airway closure. Pt had increased residue in pyriform sinus and at the UES, indicating reduced UES opening with liquids. However, pt benefited from min verbal cues for additional swallows to reduce residue. Pt used anterior mastication for solids due to missing dentition; however, pt able to transfer and swallow solids without difficulty. Pt reported frequent globus sensation and need to swallow multiple times; therefore, recommend further GI/esophageal evaluation to better determine source of symptoms (discussed with MD, esophagram completed after this study). Given grossly functional oropharyngeal swallow , SLP services not indicated and pt is safe to continue with thin liquid and regular solid diet as tollerated.        Swallow Evaluation Recommendations   Recommended Consults: Consider GI evaluation;Consider esophageal assessment   SLP Diet Recommendations: Regular solids;Thin liquid   Liquid Administration via: Cup;Straw   Medication Administration: Whole meds with liquid   Supervision: Patient able to self feed   Compensations: Slow rate;Small sips/bites   Postural Changes: Seated upright at 90 degrees   Oral Care Recommendations: Oral care BID      [name] SLP Student Clinician   [name] 06/04/2017,1:44 PM

## 2017-06-05 ENCOUNTER — Encounter: Payer: Self-pay | Admitting: Gastroenterology

## 2017-06-05 ENCOUNTER — Other Ambulatory Visit: Payer: Self-pay

## 2017-06-05 DIAGNOSIS — H401113 Primary open-angle glaucoma, right eye, severe stage: Secondary | ICD-10-CM | POA: Diagnosis not present

## 2017-06-05 DIAGNOSIS — R933 Abnormal findings on diagnostic imaging of other parts of digestive tract: Secondary | ICD-10-CM

## 2017-07-01 ENCOUNTER — Other Ambulatory Visit: Payer: Medicare Other

## 2017-07-01 DIAGNOSIS — E7800 Pure hypercholesterolemia, unspecified: Secondary | ICD-10-CM | POA: Diagnosis not present

## 2017-07-01 LAB — ALT: ALT: 9 U/L (ref 6–29)

## 2017-07-01 LAB — LIPID PANEL
Cholesterol: 174 mg/dL (ref <200)
HDL: 78 mg/dL (ref >50)
LDL Cholesterol (Calc): 82 mg/dL (calc)
NON-HDL CHOLESTEROL (CALC): 96 mg/dL (ref <130)
Total CHOL/HDL Ratio: 2.2 (calc) (ref <5.0)
Triglycerides: 59 mg/dL (ref <150)

## 2017-07-17 ENCOUNTER — Ambulatory Visit: Payer: Medicare Other | Admitting: Gastroenterology

## 2017-07-29 ENCOUNTER — Encounter: Payer: Self-pay | Admitting: Nurse Practitioner

## 2017-07-29 ENCOUNTER — Ambulatory Visit (INDEPENDENT_AMBULATORY_CARE_PROVIDER_SITE_OTHER): Payer: Medicare Other | Admitting: Nurse Practitioner

## 2017-07-29 ENCOUNTER — Telehealth: Payer: Self-pay | Admitting: *Deleted

## 2017-07-29 VITALS — BP 156/86 | HR 67 | Temp 97.9°F | Ht 63.0 in | Wt 159.0 lb

## 2017-07-29 DIAGNOSIS — M940 Chondrocostal junction syndrome [Tietze]: Secondary | ICD-10-CM

## 2017-07-29 NOTE — Telephone Encounter (Signed)
Appointment scheduled for today 

## 2017-07-29 NOTE — Progress Notes (Signed)
Careteam: Patient Care Team: Kirt Boys, DO as PCP - General (Internal Medicine) Regan Lemming, MD as Consulting Physician (Cardiology)  Advanced Directive information    No Known Allergies  Chief Complaint  Patient presents with  . Acute Visit    Pt is being seen due to left sided, mid back pain that started 1 day ago. Pt unaware of any injury.      HPI: Patient is a 84 y.o. female seen in the office today due to mid back pain started yesterday. Reports it does not hurt unless she moves or tries to get up.  No fall or injury.  2 weeks ago had a productive cough, this has resolved except the occasional cough.  No fever or chills.  No shortness of breath. Overall feel well.  Pain on right side under shoulder blade.  No increase in activity- cut grass 1 week ago Pt with hx of back pain, xray in September 2018 noted: No fracture or spondylolisthesis is noted. Moderate degenerative disc disease is noted at L4-5 and L5-S1. Degenerative changes seen involving posterior facet joints of L4-5.  2013: There is straightening and slight kyphotic curvature of  the cervical spine.  There is disc space narrowing at C3-4, C4-5 and C5-6.  There is facet degeneration at C6-7 with anterolisthesis of 2 mm.  There is mild foraminal encroachment by osteophytes bilaterally at C3-4, C4-5 and C5-6.  Findings appear quite similar to the study of 2010.  Pain is 10/10 when she tries to move. Took an aleve yesterday which helped pain.  Feels like something is pulling apart when she tries to get up. Described as an ache.   Review of Systems:  Review of Systems  Constitutional: Negative for chills, fever, malaise/fatigue and weight loss.  HENT: Negative for congestion.   Respiratory: Positive for cough (rare). Negative for sputum production, shortness of breath and wheezing.   Cardiovascular: Negative for chest pain.  Musculoskeletal: Positive for back pain and myalgias.  Neurological:  Negative for dizziness and tremors.    Past Medical History:  Diagnosis Date  . Bifascicular block 12/03/2016  . Diabetes mellitus without complication ([facility])   . Hypertension   . Mobitz I 12/03/2016   History reviewed. No pertinent surgical history. Social History:   reports that she has never smoked. She has never used smokeless tobacco. She reports that she does not drink alcohol or use drugs.  Family History  Problem Relation Age of Onset  . Heart disease Mother   . Heart attack Mother   . Cancer Brother   . Breast cancer Daughter   . Breast cancer Maternal Aunt   . Breast cancer Cousin     Medications: Patient's Medications  New Prescriptions   No medications on file  Previous Medications   ACETAMINOPHEN (TYLENOL) 325 MG TABLET    Take 325 mg by mouth daily as needed for mild pain or moderate pain.   ASPIRIN EC 81 MG TABLET    Take 81 mg by mouth daily.   ATORVASTATIN (LIPITOR) 10 MG TABLET    Take 1 tablet (10 mg total) by mouth at bedtime.  Modified Medications   No medications on file  Discontinued Medications   AMLODIPINE (NORVASC) 5 MG TABLET    Take 1 tablet (5 mg total) by mouth daily.     Physical Exam:  Vitals:   07/29/17 1518  BP: (!) 156/86  Pulse: 67  Temp: 97.9 F (36.6 C)  TempSrc: Oral  SpO2: 99%  Weight: 159 lb (72.1 kg)  Height: 5\' 3"  (1.6 m)   Body mass index is 28.17 kg/m.  Physical Exam  Constitutional: She appears well-developed and well-nourished.  HENT:  Head: Normocephalic and atraumatic.  Right Ear: External ear normal.  Left Ear: External ear normal.  Nose: Nose normal.  Mouth/Throat: Oropharynx is clear and moist. No oropharyngeal exudate.  Cardiovascular: Normal rate, regular rhythm and normal heart sounds.  Pulmonary/Chest: Effort normal and breath sounds normal.      Tenderness noted along and between ribs on right below shoulder blade     Labs reviewed: Basic Metabolic Panel: Recent Labs    11/12/16 0852  12/18/16 0950 05/28/17 1135  NA 144  --  143  K 5.0  --  4.7  CL 107  --  108  CO2 22  --  28  GLUCOSE 99  --  109*  BUN 18  --  15  CREATININE 1.17*  --  1.12*  CALCIUM 9.4  --  9.9  TSH 5.96* 4.91*  --    Liver Function Tests: Recent Labs    11/12/16 0852 07/01/17 1023  AST 14  --   ALT 6 9  ALKPHOS 98  --   BILITOT 0.3  --   PROT 6.8  --   ALBUMIN 4.0  --    No results for input(s): LIPASE, AMYLASE in the last 8760 hours. No results for input(s): AMMONIA in the last 8760 hours. CBC: Recent Labs    11/12/16 0852  WBC 5.2  NEUTROABS 2,340  HGB 11.8  HCT 38.0  MCV 95.7  PLT 219   Lipid Panel: Recent Labs    02/26/17 1108 05/28/17 1135 07/01/17 1023  CHOL 249* 284* 174  HDL 83 96 78  LDLCALC 146* 171* 82  TRIG 90 71 59  CHOLHDL 3.0 3.0 2.2   TSH: Recent Labs    11/12/16 0852 12/18/16 0950  TSH 5.96* 4.91*   A1C: Lab Results  Component Value Date   HGBA1C 6.0 (H) 05/28/2017     Assessment/Plan 1. Costochondritis, acute Increased pain along ribs, mostly likely from recent URI with increase cough which has now improved but now with acute costochondritis   To use heat TID followed by muscle rub x 7 days To use aleve BID for 3 days  -tylenol 650 mg by mouth every 6 hours as needed for pain  Return precautions discussed   Shanda BumpsJessica K. Biagio BorgEubanks, AGNP  [facility] [phone]

## 2017-07-29 NOTE — Patient Instructions (Signed)
To use heat then muscle rub (ex Aspercreme with lidocaine) three times daily to painful area To use aleve 1 tablet twice daily for 3 days to help with inflammation Tylenol 650 mg by mouth every 6 hours as needed for pain   Costochondritis Costochondritis is swelling and irritation (inflammation) of the tissue (cartilage) that connects your ribs to your breastbone (sternum). This causes pain in the front of your chest. The pain usually starts gradually and involves more than one rib. What are the causes? The exact cause of this condition is not always known. It results from stress on the cartilage where your ribs attach to your sternum. The cause of this stress could be:  Chest injury (trauma).  Exercise or activity, such as lifting.  Severe coughing.  What increases the risk? You may be at higher risk for this condition if you:  Are female.  Are 67?82 years old.  Recently started a new exercise or work activity.  Have low levels of vitamin D.  Have a condition that makes you cough frequently.  What are the signs or symptoms? The main symptom of this condition is chest pain. The pain:  Usually starts gradually and can be sharp or dull.  Gets worse with deep breathing, coughing, or exercise.  Gets better with rest.  May be worse when you press on the sternum-rib connection (tenderness).  How is this diagnosed? This condition is diagnosed based on your symptoms, medical history, and a physical exam. Your health care provider will check for tenderness when pressing on your sternum. This is the most important finding. You may also have tests to rule out other causes of chest pain. These may include:  A chest X-ray to check for lung problems.  An electrocardiogram (ECG) to see if you have a heart problem that could be causing the pain.  An imaging scan to rule out a chest or rib fracture.  How is this treated? This condition usually goes away on its own over time. Your  health care provider may prescribe an NSAID to reduce pain and inflammation. Your health care provider may also suggest that you:  Rest and avoid activities that make pain worse.  Apply heat or cold to the area to reduce pain and inflammation.  Do exercises to stretch your chest muscles.  If these treatments do not help, your health care provider may inject a numbing medicine at the sternum-rib connection to help relieve the pain. Follow these instructions at home:  Avoid activities that make pain worse. This includes any activities that use chest, abdominal, and side muscles.  If directed, put ice on the painful area: ? Put ice in a plastic bag. ? Place a towel between your skin and the bag. ? Leave the ice on for 20 minutes, 2-3 times a day.  If directed, apply heat to the affected area as often as told by your health care provider. Use the heat source that your health care provider recommends, such as a moist heat pack or a heating pad. ? Place a towel between your skin and the heat source. ? Leave the heat on for 20-30 minutes. ? Remove the heat if your skin turns bright red. This is especially important if you are unable to feel pain, heat, or cold. You may have a greater risk of getting burned.  Take over-the-counter and prescription medicines only as told by your health care provider.  Return to your normal activities as told by your health care provider. Ask  your health care provider what activities are safe for you.  Keep all follow-up visits as told by your health care provider. This is important. Contact a health care provider if:  You have chills or a fever.  Your pain does not go away or it gets worse.  You have a cough that does not go away (is persistent). Get help right away if:  You have shortness of breath. This information is not intended to replace advice given to you by your health care provider. Make sure you discuss any questions you have with your health  care provider. Document Released: 01/31/2005 Document Revised: 11/11/2015 Document Reviewed: 08/17/2015 Elsevier Interactive Patient Education  [employer]2018 [employer]

## 2017-07-29 NOTE — Telephone Encounter (Signed)
Patient called and left message on Clinical Intake stating that she was having back pain. Stated that it hurt to sit down or stand up.   Tried calling patient back to get more information and to schedule an appointment but phone just rings and message stating to call back the wireless caller is unavailable. Will try again later.

## 2017-07-30 DIAGNOSIS — H401113 Primary open-angle glaucoma, right eye, severe stage: Secondary | ICD-10-CM | POA: Diagnosis not present

## 2017-08-05 ENCOUNTER — Ambulatory Visit: Payer: Medicare Other | Admitting: Cardiology

## 2017-08-05 ENCOUNTER — Encounter: Payer: Self-pay | Admitting: Cardiology

## 2017-08-05 VITALS — BP 160/90 | HR 62 | Ht 63.0 in | Wt 166.6 lb

## 2017-08-05 DIAGNOSIS — I441 Atrioventricular block, second degree: Secondary | ICD-10-CM

## 2017-08-05 DIAGNOSIS — I10 Essential (primary) hypertension: Secondary | ICD-10-CM

## 2017-08-05 MED ORDER — AMLODIPINE BESYLATE 5 MG PO TABS: 5.0000 mg | ORAL_TABLET | Freq: Every day | ORAL | 11 refills | Status: DC

## 2017-08-05 NOTE — Progress Notes (Signed)
Electrophysiology Office Note   Date:  08/05/2017   ID:  Wendy Yang, DOB January 01, 1926, MRN [id number]  PCP:  Kirt Boysarter, Monica, DO  Cardiologist:  Duke Salviaandolph Primary Electrophysiologist:  Kamauri Denardo Jorja LoaMartin Brandee Markin, MD    Chief Complaint  Patient presents with  . Follow-up    Mobitz 1     History of Present Illness: Wendy Yang is a 84 y.o. female who is being seen today for the evaluation of bradycardia at the request of Kirt Boysarter, Monica, DO. Presenting today for electrophysiology evaluation. She has a history of hypertension, hyperlipidemia, diabetes. She presented to cardiology clinic with bradycardia. Heart rates were in the 40s when she visited her primary physician. She was referred to the emergency room is no cardiology appointments were available. In emergency room, she was asymptomatic and thus followed up in cardiology clinic. Heart rate at the time was 40s to upper 50s.   Today, denies symptoms of palpitations, chest pain, shortness of breath, orthopnea, PND, lower extremity edema, claudication, dizziness, presyncope, syncope, bleeding, or neurologic sequela. The patient is tolerating medications without difficulties.  Overall she is feeling well.  She has had no chest pain, shortness of breath, presyncope or syncope.   Past Medical History:  Diagnosis Date  . Bifascicular block 12/03/2016  . Diabetes mellitus without complication (HCC)   . Hypertension   . Mobitz I 12/03/2016   History reviewed. No pertinent surgical history.   Current Outpatient Medications  Medication Sig Dispense Refill  . acetaminophen (TYLENOL) 325 MG tablet Take 325 mg by mouth daily as needed for mild pain or moderate pain.    Marland Kitchen. amLODipine (NORVASC) 5 MG tablet Take 1 tablet (5 mg total) by mouth daily. 30 tablet 11   No current facility-administered medications for this visit.     Allergies:   Patient has no known allergies.   Social History:  The patient  reports that she has never smoked.  She has never used smokeless tobacco. She reports that she does not drink alcohol or use drugs.   Family History:  The patient's family history includes Breast cancer in her cousin, daughter, and maternal aunt; Cancer in her brother; Heart attack in her mother; Heart disease in her mother.    ROS:  Please see the history of present illness.   Otherwise, review of systems is positive for type change, hearing loss.   All other systems are reviewed and negative.   PHYSICAL EXAM: VS:  BP (!) 160/90   Pulse 62   Ht 5\' 3"  (1.6 m)   Wt 166 lb 9.6 oz (75.6 kg)   BMI 29.51 kg/m  , BMI Body mass index is 29.51 kg/m. GEN: Well nourished, well developed, in no acute distress  HEENT: normal  Neck: no JVD, carotid bruits, or masses Cardiac: RRR; no murmurs, rubs, or gallops,no edema  Respiratory:  clear to auscultation bilaterally, normal work of breathing GI: soft, nontender, nondistended, + BS MS: no deformity or atrophy  Skin: warm and dry Neuro:  Strength and sensation are intact Psych: euthymic mood, full affect  EKG:  EKG is ordered today. Personal review of the ekg ordered shows sinus rhythm, right bundle branch block, left anterior fascicular block, first-degree AV block, rate 62  Recent Labs: 11/12/2016: Hemoglobin 11.8; Platelets 219 12/18/2016: TSH 4.91 05/28/2017: BUN 15; Creat 1.12; Potassium 4.7; Sodium 143 07/01/2017: ALT 9    Lipid Panel     Component Value Date/Time   CHOL 174 07/01/2017 1023   TRIG 59  07/01/2017 1023   HDL 78 07/01/2017 1023   CHOLHDL 2.2 07/01/2017 1023   VLDL 13 11/12/2016 0852   LDLCALC 82 07/01/2017 1023   LDLDIRECT 102 (H) 12/28/2011 1051     Wt Readings from Last 3 Encounters:  08/05/17 166 lb 9.6 oz (75.6 kg)  07/29/17 159 lb (72.1 kg)  05/28/17 161 lb 3.2 oz (73.1 kg)      Other studies Reviewed: Additional studies/ records that were reviewed today include: TTE 06/03/16  Review of the above records today demonstrates:  - Left  ventricle: The cavity size was normal. Wall thickness was increased in a pattern of mild LVH. There was mild concentric hypertrophy. Systolic function was normal. The estimated ejection fraction was in the range of 50% to 55%. Wall motion was normal; there were no regional wall motion abnormalities. Doppler parameters are consistent with abnormal left ventricular relaxation (grade 1 diastolic dysfunction). - Aortic valve: Trileaflet; mildly thickened, mildly calcified leaflets. Sclerosis without stenosis. - Mitral valve: Mildly calcified annulus. Mildly thickened leaflets .  7 day monitor 12/18/16 - personally reviewed Predominant rhythm: sinus bradycardia Max heart rate: 116 bpm Min heart rate: 30 bpm  ASSESSMENT AND PLAN:  1.  Trifascicular block: Has been chronic for some time.  She has not showed any signs of syncope, weakness, fatigue.  We Karina Nofsinger continue to monitor.  Avoid AV nodal blocking agents.  2. Hypertension: Pressure elevated today.  Has been somewhat normal in the past but with its measurement today, we Kaid Seeberger start her on Norvasc 5 mg.    Current medicines are reviewed at length with the patient today.   The patient does not have concerns regarding her medicines.  The following changes were made today: Norvasc  Labs/ tests ordered today include:  Orders Placed This Encounter  Procedures  . EKG 12-Lead     Disposition:   FU with Doriana Mazurkiewicz 12 months  Signed, Flornce Record Jorja Loa, MD  08/05/2017 10:25 AM     [facility] [address] [place] [address] [phone] (office) [phone] (fax)

## 2017-08-05 NOTE — Patient Instructions (Addendum)
Medication Instructions:  Your physician has recommended you make the following change in your medication:  1. START Amlodipine 5 mg daily  Labwork: None ordered  Testing/Procedures: None ordered  Follow-Up: Your physician wants you to follow-up in: 1 year with Dr. Elberta Fortisamnitz.  You will receive a reminder letter in the mail two months in advance. If you don't receive a letter, please call our office to schedule the follow-up appointment.  * If you need a refill on your cardiac medications before your next appointment, please call your pharmacy.   *Please note that any paperwork needing to be filled out by the provider will need to be addressed at the front desk prior to seeing the provider. Please note that any FMLA, disability or other documents regarding health condition is subject to a $25.00 charge that must be received prior to completion of paperwork in the form of a money order or check.  Thank you for choosing [facility]!!   Dory HornSherri Vasti Yagi, RN [phone]  Any Other Special Instructions Will Be Listed Below (If Applicable).  Please follow up with your primary doctor about your new blood pressure medicine, Norvasc.  Let them know you are taking it.  If you have side effects after starting it or it is not controlling your blood pressure then follow up with them.

## 2017-08-28 ENCOUNTER — Ambulatory Visit (INDEPENDENT_AMBULATORY_CARE_PROVIDER_SITE_OTHER): Payer: Medicare Other | Admitting: Internal Medicine

## 2017-08-28 ENCOUNTER — Encounter: Payer: Self-pay | Admitting: Internal Medicine

## 2017-08-28 VITALS — BP 130/82 | HR 68 | Temp 98.0°F | Ht 63.0 in | Wt 162.0 lb

## 2017-08-28 DIAGNOSIS — I10 Essential (primary) hypertension: Secondary | ICD-10-CM

## 2017-08-28 DIAGNOSIS — R7303 Prediabetes: Secondary | ICD-10-CM | POA: Diagnosis not present

## 2017-08-28 DIAGNOSIS — M25461 Effusion, right knee: Secondary | ICD-10-CM

## 2017-08-28 DIAGNOSIS — M25411 Effusion, right shoulder: Secondary | ICD-10-CM | POA: Diagnosis not present

## 2017-08-28 DIAGNOSIS — M25511 Pain in right shoulder: Secondary | ICD-10-CM | POA: Diagnosis not present

## 2017-08-28 DIAGNOSIS — E782 Mixed hyperlipidemia: Secondary | ICD-10-CM | POA: Diagnosis not present

## 2017-08-28 DIAGNOSIS — M25561 Pain in right knee: Secondary | ICD-10-CM

## 2017-08-28 DIAGNOSIS — Z79899 Other long term (current) drug therapy: Secondary | ICD-10-CM | POA: Insufficient documentation

## 2017-08-28 LAB — COMPLETE METABOLIC PANEL WITH GFR
AG RATIO: 1.5 (calc) (ref 1.0–2.5)
ALKALINE PHOSPHATASE (APISO): 88 U/L (ref 33–130)
ALT: 7 U/L (ref 6–29)
AST: 14 U/L (ref 10–35)
Albumin: 4 g/dL (ref 3.6–5.1)
BILIRUBIN TOTAL: 0.6 mg/dL (ref 0.2–1.2)
BUN/Creatinine Ratio: 11 (calc) (ref 6–22)
BUN: 12 mg/dL (ref 7–25)
CHLORIDE: 107 mmol/L (ref 98–110)
CO2: 29 mmol/L (ref 20–32)
Calcium: 9.4 mg/dL (ref 8.6–10.4)
Creat: 1.11 mg/dL — ABNORMAL HIGH (ref 0.60–0.88)
GFR, EST NON AFRICAN AMERICAN: 44 mL/min/{1.73_m2} — AB (ref >=60)
GFR, Est African American: 51 mL/min/{1.73_m2} — ABNORMAL LOW (ref >=60)
GLOBULIN: 2.7 g/dL (ref 1.9–3.7)
Glucose, Bld: 102 mg/dL — ABNORMAL HIGH (ref 65–99)
POTASSIUM: 4.8 mmol/L (ref 3.5–5.3)
SODIUM: 142 mmol/L (ref 135–146)
Total Protein: 6.7 g/dL (ref 6.1–8.1)

## 2017-08-28 LAB — LIPID PANEL
CHOLESTEROL: 230 mg/dL — AB (ref <200)
HDL: 70 mg/dL (ref >50)
LDL Cholesterol (Calc): 142 mg/dL (calc) — ABNORMAL HIGH
NON-HDL CHOLESTEROL (CALC): 160 mg/dL — AB (ref <130)
TRIGLYCERIDES: 74 mg/dL (ref <150)
Total CHOL/HDL Ratio: 3.3 (calc) (ref <5.0)

## 2017-08-28 NOTE — Progress Notes (Signed)
Patient ID: Wendy Yang, female   DOB: 02/26/1926, 84 y.o.   MRN: [id number]   Location:  [facility] OFFICE  Provider: DR Arletha Grippe  Code Status:  Goals of Care:  Advanced Directives 01/09/2017  Does Patient Have a Medical Advance Directive? No  Type of Advance Directive -  Does patient want to make changes to medical advance directive? -  Copy of [place] in Chart? -  Would patient like information on creating a medical advance directive? -     Chief Complaint  Patient presents with  . Medical Management of Chronic Issues    3 month folllow-up on HTN, Hyperlipidemia, dysphagia and prediabetes. Patient c/o right leg pain, warmth at night and cramping. Patient also with soreness in 4th toe (right foot), right hand/arm and right ear.   . Advance Care Planning    Refused ACP   . Medication Refill    No refills needed   . Health Maintenance    All vaccines previously postponed. MALB due     HPI: Patient is a 84 y.o. female seen today for medical management of chronic diseases.  She c/o right ankle/leg at night with burning sensation and occasional cramp with movement. She notes right knee pops with movement. She has right shoulder pain x few mos that worsens with use. She rarely takes aleve but most times takes tylenol which helps. She applies topical aspercreme or a spray which helps. Her pain goes as high as 8/10 on scale. Current pain is 5/10 on scale.  Discussed pneumonia vaccines - she declines prevnar and pneumovax  HTN - controlled on amlodipine. Takes ASA daily  Bradycardia - Mobitz I/bifascicular block. Unchanged. No palpitations. Followed by cardio Dr Curt Bears  Prediabetes - stable. A1c 6%  Hyperlipidemia - diet controlled. LDL 82; Tchol 174  hiatal hernia - she reports frequent heartburn and she keeps "swallowing and swallowing" which helps. No recent GI w/u.   Past Medical History:  Diagnosis Date  . Bifascicular block 12/03/2016  . Diabetes  mellitus without complication ([place])   . Hypertension   . Mobitz I 12/03/2016    History reviewed. No pertinent surgical history.   reports that she has never smoked. She has never used smokeless tobacco. She reports that she does not drink alcohol or use drugs. Social History   Socioeconomic History  . Marital status: Divorced    Spouse name: Not on file  . Number of children: Not on file  . Years of education: Not on file  . Highest education level: Not on file  Occupational History  . Not on file  Social Needs  . Financial resource strain: Not on file  . Food insecurity:    Worry: Not on file    Inability: Not on file  . Transportation needs:    Medical: Not on file    Non-medical: Not on file  Tobacco Use  . Smoking status: Never Smoker  . Smokeless tobacco: Never Used  Substance and Sexual Activity  . Alcohol use: No    Alcohol/week: 0.0 oz  . Drug use: No  . Sexual activity: Not Currently  Lifestyle  . Physical activity:    Days per week: Not on file    Minutes per session: Not on file  . Stress: Not on file  Relationships  . Social connections:    Talks on phone: Not on file    Gets together: Not on file    Attends religious service: Not on file  Active member of club or organization: Not on file    Attends meetings of clubs or organizations: Not on file    Relationship status: Not on file  . Intimate partner violence:    Fear of current or ex partner: Not on file    Emotionally abused: Not on file    Physically abused: Not on file    Forced sexual activity: Not on file  Other Topics Concern  . Not on file  Social History Narrative   Diet:    Do you drink/eat things with caffeine? Coffee   Marital status: Divorced                        What year were you married? 1946   Do you live in a house, apartment, assisted living, condo, trailer, etc)? House   Is it one or more stories?  One   How many persons live in your home? 1   Do you have any pets in  your home? No   Current or past profession: Food Packer   Do you exercise?  Yes                                                   Type & how often: Walk, yard work    Do you have a living will?    Do you have a DNR Form?   Do you have a POA/HPOA forms?          Family History  Problem Relation Age of Onset  . Heart disease Mother   . Heart attack Mother   . Cancer Brother   . Breast cancer Daughter   . Breast cancer Maternal Aunt   . Breast cancer Cousin     No Known Allergies  Outpatient Encounter Medications as of 08/28/2017  Medication Sig  . acetaminophen (TYLENOL) 325 MG tablet Take 325 mg by mouth daily as needed for mild pain or moderate pain.  Marland Kitchen amLODipine (NORVASC) 5 MG tablet Take 1 tablet (5 mg total) by mouth daily.   No facility-administered encounter medications on file as of 08/28/2017.     Review of Systems:  Review of Systems  Cardiovascular: Positive for leg swelling.  Musculoskeletal: Positive for arthralgias and joint swelling.  All other systems reviewed and are negative.   Health Maintenance  Topic Date Due  . URINE MICROALBUMIN  06/28/1937  . FOOT EXAM  02/10/2015  . INFLUENZA VACCINE  05/08/2023 (Originally 12/05/2017)  . PNA vac Low Risk Adult (1 of 2 - PCV13) 11/17/2025 (Originally 06/28/1992)  . TETANUS/TDAP  11/19/2029 (Originally 06/28/1946)  . OPHTHALMOLOGY EXAM  10/23/2017  . HEMOGLOBIN A1C  11/25/2017  . DEXA SCAN  Completed    Physical Exam: Vitals:   08/28/17 1028  BP: 130/82  Pulse: 68  Temp: 98 F (36.7 C)  TempSrc: Oral  SpO2: 98%  Weight: 162 lb (73.5 kg)  Height: _0  (1.6 m)   Body mass index is 28.7 kg/m. Physical Exam  Constitutional: She is oriented to person, place, and time. She appears well-developed and well-nourished.  HENT:  Mouth/Throat: Oropharynx is clear and moist. No oropharyngeal exudate.  Eyes: Pupils are equal, round, and reactive to light. No scleral icterus.  Neck: Neck supple. Carotid bruit is  not present. No tracheal deviation present. No thyromegaly  present.  Cardiovascular: Regular rhythm and intact distal pulses. Bradycardia present. Exam reveals no gallop and no friction rub.  Murmur (1/6 SEM) heard. trace BLE edema b/l. no calf TTP. Varicose veins b/l LE but no redness or TTP  Pulmonary/Chest: Effort normal and breath sounds normal. No stridor. No respiratory distress. She has no wheezes. She has no rales.  Abdominal: Soft. Bowel sounds are normal. She exhibits no distension and no mass. There is no hepatomegaly. There is no tenderness. There is no rebound and no guarding.  Musculoskeletal: She exhibits edema and tenderness.  R>L knee swelling with crepitus on ROM; right shoulder swelling with slightly reduced ROM  Lymphadenopathy:    She has no cervical adenopathy.  Neurological: She is alert and oriented to person, place, and time. She has normal reflexes.  Skin: Skin is warm and dry. No rash noted.  Psychiatric: She has a normal mood and affect. Her behavior is normal. Judgment and thought content normal.    Labs reviewed: Basic Metabolic Panel: Recent Labs    11/12/16 0852 12/18/16 0950 05/28/17 1135  NA 144  --  143  K 5.0  --  4.7  CL 107  --  108  CO2 22  --  28  GLUCOSE 99  --  109*  BUN 18  --  15  CREATININE 1.17*  --  1.12*  CALCIUM 9.4  --  9.9  TSH 5.96* 4.91*  --    Liver Function Tests: Recent Labs    11/12/16 0852 07/01/17 1023  AST 14  --   ALT 6 9  ALKPHOS 98  --   BILITOT 0.3  --   PROT 6.8  --   ALBUMIN 4.0  --    No results for input(s): LIPASE, AMYLASE in the last 8760 hours. No results for input(s): AMMONIA in the last 8760 hours. CBC: Recent Labs    11/12/16 0852  WBC 5.2  NEUTROABS 2,340  HGB 11.8  HCT 38.0  MCV 95.7  PLT 219   Lipid Panel: Recent Labs    02/26/17 1108 05/28/17 1135 07/01/17 1023  CHOL 249* 284* 174  HDL 83 96 78  LDLCALC 146* 171* 82  TRIG 90 71 59  CHOLHDL 3.0 3.0 2.2   Lab Results    Component Value Date   HGBA1C 6.0 (H) 05/28/2017    Procedures since last visit: No results found.  Assessment/Plan   ICD-10-CM   1. Pain and swelling of right knee M25.561 Ambulatory referral to Orthopedic Surgery   M25.461   2. Pain and swelling of right shoulder M25.511 Ambulatory referral to Orthopedic Surgery   M25.411   3. Prediabetes R73.03 Hemoglobin A1c  4. Mixed hyperlipidemia E78.2 Lipid Panel  5. Essential hypertension I10   6. High risk medication use Z79.899 CMP with eGFR([facility])   Will call with ortho referral for knee and shoulder pain  Will call with lab results  Continue current medications as ordered  Continue diet and exercise program   Follow up with specialists as scheduled  Follow up in 4 mos for prediabetes, HTN, hyperlipidemia    Kielyn Kardell S. Perlie Gold  [facility] [address] [place], [place] [address] [phone] Cell (Monday-Friday 8 AM - 5 PM) [phone] After 5 PM and follow prompts

## 2017-08-28 NOTE — Patient Instructions (Addendum)
Will call with ortho referral for knee and shoulder pain  Will call with lab results  Continue current medications as ordered  Continue diet and exercise program   Follow up with specialists as scheduled  Follow up in 4 mos for prediabetes, HTN, hyperlipidemia

## 2017-08-29 LAB — HEMOGLOBIN A1C
Hgb A1c MFr Bld: 5.8 % of total Hgb — ABNORMAL HIGH (ref <5.7)
Mean Plasma Glucose: 120 (calc)
eAG (mmol/L): 6.6 (calc)

## 2017-09-03 DIAGNOSIS — H401113 Primary open-angle glaucoma, right eye, severe stage: Secondary | ICD-10-CM | POA: Diagnosis not present

## 2017-09-05 ENCOUNTER — Ambulatory Visit (INDEPENDENT_AMBULATORY_CARE_PROVIDER_SITE_OTHER): Payer: Medicare Other

## 2017-09-05 ENCOUNTER — Ambulatory Visit (INDEPENDENT_AMBULATORY_CARE_PROVIDER_SITE_OTHER): Payer: Medicare Other | Admitting: Orthopaedic Surgery

## 2017-09-05 DIAGNOSIS — G8929 Other chronic pain: Secondary | ICD-10-CM | POA: Diagnosis not present

## 2017-09-05 DIAGNOSIS — M1711 Unilateral primary osteoarthritis, right knee: Secondary | ICD-10-CM

## 2017-09-05 DIAGNOSIS — M25511 Pain in right shoulder: Secondary | ICD-10-CM

## 2017-09-05 DIAGNOSIS — M25571 Pain in right ankle and joints of right foot: Secondary | ICD-10-CM

## 2017-09-05 NOTE — Progress Notes (Signed)
Office Visit Note   Patient: Wendy Yang           Date of Birth: 1925/12/04           MRN: [id number] Visit Date: 09/05/2017              Requested by: Kirt Boys, DO [address] [place], [place] [address] PCP: Kirt Boys, DO   Assessment & Plan: Visit Diagnoses:  1. Chronic right shoulder pain   2. Pain in right ankle and joints of right foot   3. Primary osteoarthritis of right knee     Plan: Impression is 84 year old female with right rotator cuff arthropathy, right knee tricompartmental degenerative joint disease, right ankle peroneal tendinitis.  For the right shoulder I would like to get her set up with Dr. Alvester Morin for a right shoulder injection.  Patient in terms of function is actually pretty well compensated.  For the right knee which is not bothering her all that bad so she is just going to take over-the-counter NSAIDs as needed.  For the right ankle I recommended an ASO brace and topical and oral anti-inflammatories as needed patient declined physical therapy.  Follow-up with me as needed.  Follow-Up Instructions: Return if symptoms worsen or fail to improve.   Orders:  Orders Placed This Encounter  Procedures  . XR KNEE 3 VIEW RIGHT  . XR Ankle Complete Right  . XR Shoulder Right  . Ambulatory referral to Physical Medicine Rehab   No orders of the defined types were placed in this encounter.     Procedures: No procedures performed   Clinical Data: No additional findings.   Subjective: Chief Complaint  Patient presents with  . Right Knee - Pain  . Right Shoulder - Pain    Wendy Yang is a very pleasant 84 year old female who comes in today with complaints of right ankle and right knee and right shoulder pain.  Her shoulder and ankle pain are the worst.  She denies any injuries.  She has pain with her right arm.  She has had previous rotator cuff surgery decades ago.  Denies any numbness and tingling.  Denies any constitutional  symptoms.   Review of Systems  Constitutional: Negative.   HENT: Negative.   Eyes: Negative.   Respiratory: Negative.   Cardiovascular: Negative.   Endocrine: Negative.   Musculoskeletal: Negative.   Neurological: Negative.   Hematological: Negative.   Psychiatric/Behavioral: Negative.   All other systems reviewed and are negative.    Objective: Vital Signs: There were no vitals taken for this visit.  Physical Exam  Constitutional: She is oriented to person, place, and time. She appears well-developed and well-nourished.  HENT:  Head: Normocephalic and atraumatic.  Eyes: EOM are normal.  Neck: Neck supple.  Pulmonary/Chest: Effort normal.  Abdominal: Soft.  Neurological: She is alert and oriented to person, place, and time.  Skin: Skin is warm. Capillary refill takes less than 2 seconds.  Psychiatric: She has a normal mood and affect. Her behavior is normal. Judgment and thought content normal.  Nursing note and vitals reviewed.   Ortho Exam Right shoulder exam shows good passive and active range of motion.  She does have weakness with rotator cuff testing.  Right knee exam shows no joint effusion.  Collaterals and cruciates are stable.  2+ patellofemoral crepitus.  Right ankle exam mild tenderness along the peroneal tendons.  There is no subluxation.  Achilles is unremarkable. Specialty Comments:  No specialty comments available.  Imaging:  Xr Ankle Complete Right  Result Date: 09/05/2017 Diffuse osteopenia.  No structural abnormalities.  Xr Knee 3 View Right  Result Date: 09/05/2017 Tricompartmental degenerative joint disease  Xr Shoulder Right  Result Date: 09/05/2017 X-ray consistent with rotator cuff arthropathy and high riding humeral head.    PMFS History: Patient Active Problem List   Diagnosis Date Noted  . Mixed hyperlipidemia 08/28/2017  . High risk medication use 08/28/2017  . Mobitz I 12/03/2016  . Bifascicular block 12/03/2016  . Bradycardia  11/16/2016  . Seasonal allergic rhinitis due to pollen 11/16/2016  . Prediabetes 11/16/2016  . Abnormal ECG 11/16/2016  . Deformity of toe of left foot 11/16/2016  . Breast tenderness in female 11/16/2016  . Right-sided face pain 10/19/2014  . Viral URI with cough 05/11/2014  . Bilateral lower extremity edema 02/09/2014  . Hymenoptera allergy 10/02/2013  . Bilateral hand pain 06/16/2013  . Cerumen impaction 06/16/2013  . Elevated TSH 03/16/2013  . Dizziness and giddiness 03/06/2013  . Pain in right shoulder 03/06/2013  . Vision changes 03/06/2013  . GERD (gastroesophageal reflux disease) 09/17/2012  . Facial droop 05/20/2012  . Radicular pain in right arm 05/05/2012  . Lip swelling 05/05/2012  . Exposure to hepatitis A 05/05/2012  . Nasal congestion with rhinorrhea 12/28/2011  . Hypercholesterolemia 09/26/2011  . Hypertension 08/21/2011   Past Medical History:  Diagnosis Date  . Bifascicular block 12/03/2016  . Diabetes mellitus without complication (HCC)   . Hypertension   . Mobitz I 12/03/2016    Family History  Problem Relation Age of Onset  . Heart disease Mother   . Heart attack Mother   . Cancer Brother   . Breast cancer Daughter   . Breast cancer Maternal Aunt   . Breast cancer Cousin     No past surgical history on file. Social History   Occupational History  . Not on file  Tobacco Use  . Smoking status: Never Smoker  . Smokeless tobacco: Never Used  Substance and Sexual Activity  . Alcohol use: No    Alcohol/week: 0.0 oz  . Drug use: No  . Sexual activity: Not Currently

## 2017-09-18 ENCOUNTER — Telehealth: Payer: Self-pay | Admitting: Internal Medicine

## 2017-09-18 NOTE — Telephone Encounter (Signed)
I left a message asking the pt to call me at [phone] to schedule AWV.  I want to reschedule her 12/31/17 appt to 01/01/18 so that she can see Huntley Dec at 10:00 and Dr. Montez Morita and 10:30 if available. VDM (DD)

## 2017-10-22 ENCOUNTER — Encounter: Payer: Self-pay | Admitting: Nurse Practitioner

## 2017-10-22 ENCOUNTER — Ambulatory Visit (INDEPENDENT_AMBULATORY_CARE_PROVIDER_SITE_OTHER): Payer: Medicare Other | Admitting: Nurse Practitioner

## 2017-10-22 VITALS — BP 174/92 | HR 67 | Temp 98.0°F | Ht 63.0 in | Wt 166.0 lb

## 2017-10-22 DIAGNOSIS — R202 Paresthesia of skin: Secondary | ICD-10-CM

## 2017-10-22 DIAGNOSIS — I10 Essential (primary) hypertension: Secondary | ICD-10-CM | POA: Diagnosis not present

## 2017-10-22 MED ORDER — LOSARTAN POTASSIUM 25 MG PO TABS: 25.0000 mg | ORAL_TABLET | Freq: Every day | ORAL | 1 refills | Status: DC

## 2017-10-22 NOTE — Progress Notes (Signed)
Careteam: Patient Care Team: Kirt Boys, DO as PCP - General (Internal Medicine) Regan Lemming, MD as Consulting Physician (Cardiology)  Advanced Directive information Does Patient Have a Medical Advance Directive?: No  No Known Allergies  Chief Complaint  Patient presents with  . Acute Visit    Pt is being seen due to sudden onset of numbness of left arm 4 days ago. Pt reports pain at left shoulder blade for over a month.      HPI: Patient is a 84 y.o. female seen in the office today due to numbness in left forearm for 6 days. Reports left shoulder blade has been hurting her since April however when looking at notes it was the right shoulder at that time, she reports this is not bothering her now. At that time they recommended injection but she did not go and get this done.  Reports left shoulder has been bother her for over a month. Reports it "hurts" worse when she lays down. Uses aleve and tylenol as needed which has been effective.  Reports numbness in her left forearm from elbow to wrist. Feels like her arm is asleep. No pain noted but numbness and feels like something is crawling. Does not go into fingers or hand and no decrease in strength.   Blood pressure is high today, reports she does not take medication for blood pressure and will not take medication for it. Would rather take something natural. Aware of risk of heart attack and stroke with elevated blood pressure but would prefer not to be on medication.  States she was not taking medication back in April, reports it caused her to be dizzy with slight headaches.  Feels like her arm being numb is making her blood pressure worse.  Reports blood pressure on the tea is around 170s/80s.   Review of Systems:  Review of Systems  Constitutional: Negative for chills, diaphoresis, fever, malaise/fatigue and weight loss.  Eyes: Negative for blurred vision.  Respiratory: Negative for cough and shortness of breath.     Cardiovascular: Negative for chest pain.  Musculoskeletal: Positive for myalgias. Negative for falls and joint pain.       Pain to left shoulder blade area.   Neurological: Positive for tingling (numbness and tingling to left forearm). Negative for dizziness and headaches.    Past Medical History:  Diagnosis Date  . Bifascicular block 12/03/2016  . Diabetes mellitus without complication (HCC)   . Hypertension   . Mobitz I 12/03/2016   History reviewed. No pertinent surgical history. Social History:   reports that she has never smoked. She has never used smokeless tobacco. She reports that she does not drink alcohol or use drugs.  Family History  Problem Relation Age of Onset  . Heart disease Mother   . Heart attack Mother   . Cancer Brother   . Breast cancer Daughter   . Breast cancer Maternal Aunt   . Breast cancer Cousin     Medications: Patient's Medications  New Prescriptions   No medications on file  Previous Medications   ACETAMINOPHEN (TYLENOL) 325 MG TABLET    Take 325 mg by mouth daily as needed for mild pain or moderate pain.   AMLODIPINE (NORVASC) 5 MG TABLET    Take 1 tablet (5 mg total) by mouth daily.  Modified Medications   No medications on file  Discontinued Medications   No medications on file     Physical Exam:  Vitals:   10/22/17 1422  BP: (!) 174/92  Pulse: 67  Temp: 98 F (36.7 C)  TempSrc: Oral  SpO2: 98%  Weight: 166 lb (75.3 kg)  Height: 5\' 3"  (1.6 m)   Body mass index is 29.41 kg/m.  Physical Exam  Constitutional: She is oriented to person, place, and time. She appears well-developed and well-nourished.  HENT:  Head: Normocephalic and atraumatic.  Cardiovascular: Normal rate, regular rhythm and normal heart sounds.  Pulmonary/Chest: Effort normal and breath sounds normal.  Musculoskeletal: She exhibits tenderness. She exhibits no edema.       Left elbow: She exhibits normal range of motion, no swelling and no effusion.        Left wrist: She exhibits normal range of motion, no tenderness and no bony tenderness.       Back:  Tenderness noted  Neurological: She is alert and oriented to person, place, and time. She displays normal reflexes. A sensory deficit (decreased sensation via monofilament to left hand vs right ) is present. No cranial nerve deficit. She exhibits normal muscle tone. Coordination normal.  Skin: Skin is warm and dry.    Labs reviewed: Basic Metabolic Panel: Recent Labs    11/12/16 0852 12/18/16 0950 05/28/17 1135 08/28/17 1140  NA 144  --  143 142  K 5.0  --  4.7 4.8  CL 107  --  108 107  CO2 22  --  28 29  GLUCOSE 99  --  109* 102*  BUN 18  --  15 12  CREATININE 1.17*  --  1.12* 1.11*  CALCIUM 9.4  --  9.9 9.4  TSH 5.96* 4.91*  --   --    Liver Function Tests: Recent Labs    11/12/16 0852 07/01/17 1023 08/28/17 1140  AST 14  --  14  ALT 6 9 7   ALKPHOS 98  --   --   BILITOT 0.3  --  0.6  PROT 6.8  --  6.7  ALBUMIN 4.0  --   --    No results for input(s): LIPASE, AMYLASE in the last 8760 hours. No results for input(s): AMMONIA in the last 8760 hours. CBC: Recent Labs    11/12/16 0852  WBC 5.2  NEUTROABS 2,340  HGB 11.8  HCT 38.0  MCV 95.7  PLT 219   Lipid Panel: Recent Labs    05/28/17 1135 07/01/17 1023 08/28/17 1140  CHOL 284* 174 230*  HDL 96 78 70  LDLCALC 171* 82 142*  TRIG 71 59 74  CHOLHDL 3.0 2.2 3.3   TSH: Recent Labs    11/12/16 0852 12/18/16 0950  TSH 5.96* 4.91*   A1C: Lab Results  Component Value Date   HGBA1C 5.8 (H) 08/28/2017     Assessment/Plan 1. Hypertension, uncontrolled -uncontrolled, not taking norvsc instead using herbal tea. Blood pressure remains uncontrolled, agreeable to try another medication (side effects noted from norvasc) -also gave DASH diet and encouraged low sodium diet.  - losartan (COZAAR) 25 MG tablet; Take 1 tablet (25 mg total) by mouth daily.  Dispense: 30 tablet; Refill: 1  2. Paresthesia of  arm - DG Cervical Spine Complete; Future - CBC with Differential/Platelets; Future - TSH; Future - BASIC METABOLIC PANEL WITH GFR; Future  Next appt: 10/23/2017 Janene HarveyJessica K. Biagio BorgEubanks, AGNP  [facility] [phone]

## 2017-10-22 NOTE — Patient Instructions (Addendum)
To start losartan 25 mg by mouth daily  Come back tomorrow for lab work- does not have to be fasting   To start aleve 1 tablet by mouth twice daily for 1 week.  To use heat to shoulder three times daily for 20-30 mins for next week then apply muscle rub  To get xray of neck once you leave office   Follow up with Dr Montez Morita in 2 weeks on blood pressure  DASH Eating Plan DASH stands for "Dietary Approaches to Stop Hypertension." The DASH eating plan is a healthy eating plan that has been shown to reduce high blood pressure (hypertension). It may also reduce your risk for type 2 diabetes, heart disease, and stroke. The DASH eating plan may also help with weight loss. What are tips for following this plan? General guidelines  Avoid eating more than 2,300 mg (milligrams) of salt (sodium) a day. If you have hypertension, you may need to reduce your sodium intake to 1,500 mg a day.  Limit alcohol intake to no more than 1 drink a day for nonpregnant women and 2 drinks a day for men. One drink equals 12 oz of beer, 5 oz of wine, or 1 oz of hard liquor.  Work with your health care provider to maintain a healthy body weight or to lose weight. Ask what an ideal weight is for you.  Get at least 30 minutes of exercise that causes your heart to beat faster (aerobic exercise) most days of the week. Activities may include walking, swimming, or biking.  Work with your health care provider or diet and nutrition specialist (dietitian) to adjust your eating plan to your individual calorie needs. Reading food labels  Check food labels for the amount of sodium per serving. Choose foods with less than 5 percent of the Daily Value of sodium. Generally, foods with less than 300 mg of sodium per serving fit into this eating plan.  To find whole grains, look for the word "whole" as the first word in the ingredient list. Shopping  Buy products labeled as "low-sodium" or "no salt added."  Buy fresh foods. Avoid  canned foods and premade or frozen meals. Cooking  Avoid adding salt when cooking. Use salt-free seasonings or herbs instead of table salt or sea salt. Check with your health care provider or pharmacist before using salt substitutes.  Do not fry foods. Cook foods using healthy methods such as baking, boiling, grilling, and broiling instead.  Cook with heart-healthy oils, such as olive, canola, soybean, or sunflower oil. Meal planning   Eat a balanced diet that includes: ? 5 or more servings of fruits and vegetables each day. At each meal, try to fill half of your plate with fruits and vegetables. ? Up to 6-8 servings of whole grains each day. ? Less than 6 oz of lean meat, poultry, or fish each day. A 3-oz serving of meat is about the same size as a deck of cards. One egg equals 1 oz. ? 2 servings of low-fat dairy each day. ? A serving of nuts, seeds, or beans 5 times each week. ? Heart-healthy fats. Healthy fats called Omega-3 fatty acids are found in foods such as flaxseeds and coldwater fish, like sardines, salmon, and mackerel.  Limit how much you eat of the following: ? Canned or prepackaged foods. ? Food that is high in trans fat, such as fried foods. ? Food that is high in saturated fat, such as fatty meat. ? Sweets, desserts, sugary drinks, and  other foods with added sugar. ? Full-fat dairy products.  Do not salt foods before eating.  Try to eat at least 2 vegetarian meals each week.  Eat more home-cooked food and less restaurant, buffet, and fast food.  When eating at a restaurant, ask that your food be prepared with less salt or no salt, if possible. What foods are recommended? The items listed may not be a complete list. Talk with your dietitian about what dietary choices are best for you. Grains Whole-grain or whole-wheat bread. Whole-grain or whole-wheat pasta. Brown rice. [place]. Bulgur. Whole-grain and low-sodium cereals. Pita bread. Low-fat, low-sodium  crackers. Whole-wheat flour tortillas. Vegetables Fresh or frozen vegetables (raw, steamed, roasted, or grilled). Low-sodium or reduced-sodium tomato and vegetable juice. Low-sodium or reduced-sodium tomato sauce and tomato paste. Low-sodium or reduced-sodium canned vegetables. Fruits All fresh, dried, or frozen fruit. Canned fruit in natural juice (without added sugar). Meat and other protein foods Skinless chicken or [place]. Ground chicken or [place]. Pork with fat trimmed off. Fish and seafood. Egg whites. Dried beans, peas, or lentils. Unsalted nuts, nut butters, and seeds. Unsalted canned beans. Lean cuts of beef with fat trimmed off. Low-sodium, lean deli meat. Dairy Low-fat (1%) or fat-free (skim) milk. Fat-free, low-fat, or reduced-fat cheeses. Nonfat, low-sodium ricotta or cottage cheese. Low-fat or nonfat yogurt. Low-fat, low-sodium cheese. Fats and oils Soft margarine without trans fats. Vegetable oil. Low-fat, reduced-fat, or light mayonnaise and salad dressings (reduced-sodium). Canola, safflower, olive, soybean, and sunflower oils. Avocado. Seasoning and other foods Herbs. Spices. Seasoning mixes without salt. Unsalted popcorn and pretzels. Fat-free sweets. What foods are not recommended? The items listed may not be a complete list. Talk with your dietitian about what dietary choices are best for you. Grains Baked goods made with fat, such as croissants, muffins, or some breads. Dry pasta or rice meal packs. Vegetables Creamed or fried vegetables. Vegetables in a cheese sauce. Regular canned vegetables (not low-sodium or reduced-sodium). Regular canned tomato sauce and paste (not low-sodium or reduced-sodium). Regular tomato and vegetable juice (not low-sodium or reduced-sodium). Angie Fava. Olives. Fruits Canned fruit in a light or heavy syrup. Fried fruit. Fruit in cream or butter sauce. Meat and other protein foods Fatty cuts of meat. Ribs. Fried meat. Berniece Salines. Sausage. Bologna and  other processed lunch meats. Salami. Fatback. Hotdogs. Bratwurst. Salted nuts and seeds. Canned beans with added salt. Canned or smoked fish. Whole eggs or egg yolks. Chicken or [place] with skin. Dairy Whole or 2% milk, cream, and half-and-half. Whole or full-fat cream cheese. Whole-fat or sweetened yogurt. Full-fat cheese. Nondairy creamers. Whipped toppings. Processed cheese and cheese spreads. Fats and oils Butter. Stick margarine. Lard. Shortening. Ghee. Bacon fat. Tropical oils, such as coconut, palm kernel, or palm oil. Seasoning and other foods Salted popcorn and pretzels. Onion salt, garlic salt, seasoned salt, table salt, and sea salt. Worcestershire sauce. Tartar sauce. Barbecue sauce. Teriyaki sauce. Soy sauce, including reduced-sodium. Steak sauce. Canned and packaged gravies. Fish sauce. Oyster sauce. Cocktail sauce. Horseradish that you find on the shelf. Ketchup. Mustard. Meat flavorings and tenderizers. Bouillon cubes. Hot sauce and Tabasco sauce. Premade or packaged marinades. Premade or packaged taco seasonings. Relishes. Regular salad dressings. Where to find more information:  National Heart, Lung, and [place]: https://wilson-eaton.com/  American Heart Association: www.heart.org Summary  The DASH eating plan is a healthy eating plan that has been shown to reduce high blood pressure (hypertension). It may also reduce your risk for type 2 diabetes, heart disease, and stroke.  With the DASH eating plan, you should limit salt (sodium) intake to 2,300 mg a day. If you have hypertension, you may need to reduce your sodium intake to 1,500 mg a day.  When on the DASH eating plan, aim to eat more fresh fruits and vegetables, whole grains, lean proteins, low-fat dairy, and heart-healthy fats.  Work with your health care provider or diet and nutrition specialist (dietitian) to adjust your eating plan to your individual calorie needs. This information is not intended to replace advice  given to you by your health care provider. Make sure you discuss any questions you have with your health care provider. Document Released: 04/12/2011 Document Revised: 04/16/2016 Document Reviewed: 04/16/2016 Elsevier Interactive Patient Education  [employer]2018 Elsevier Inc.

## 2017-10-23 ENCOUNTER — Other Ambulatory Visit: Payer: Self-pay

## 2017-10-23 ENCOUNTER — Other Ambulatory Visit: Payer: Medicare Other

## 2017-10-23 DIAGNOSIS — R202 Paresthesia of skin: Secondary | ICD-10-CM | POA: Diagnosis not present

## 2017-10-24 LAB — BASIC METABOLIC PANEL WITH GFR
BUN/Creatinine Ratio: 16 (calc) (ref 6–22)
BUN: 17 mg/dL (ref 7–25)
CALCIUM: 9.5 mg/dL (ref 8.6–10.4)
CO2: 28 mmol/L (ref 20–32)
CREATININE: 1.05 mg/dL — AB (ref 0.60–0.88)
Chloride: 106 mmol/L (ref 98–110)
GFR, EST NON AFRICAN AMERICAN: 47 mL/min/{1.73_m2} — AB (ref >=60)
GFR, Est African American: 54 mL/min/{1.73_m2} — ABNORMAL LOW (ref >=60)
Glucose, Bld: 90 mg/dL (ref 65–99)
Potassium: 4.8 mmol/L (ref 3.5–5.3)
Sodium: 141 mmol/L (ref 135–146)

## 2017-10-24 LAB — CBC WITH DIFFERENTIAL/PLATELET
BASOS PCT: 0.2 %
Basophils Absolute: 9 cells/uL (ref 0–200)
EOS PCT: 1.6 %
Eosinophils Absolute: 70 cells/uL (ref 15–500)
HCT: 34.9 % — ABNORMAL LOW (ref 35.0–45.0)
Hemoglobin: 11.6 g/dL — ABNORMAL LOW (ref 11.7–15.5)
Lymphs Abs: 2072 cells/uL (ref 850–3900)
MCH: 30.4 pg (ref 27.0–33.0)
MCHC: 33.2 g/dL (ref 32.0–36.0)
MCV: 91.4 fL (ref 80.0–100.0)
MONOS PCT: 8.6 %
MPV: 10.9 fL (ref 7.5–12.5)
Neutro Abs: 1870 cells/uL (ref 1500–7800)
Neutrophils Relative %: 42.5 %
PLATELETS: 206 10*3/uL (ref 140–400)
RBC: 3.82 10*6/uL (ref 3.80–5.10)
RDW: 12.9 % (ref 11.0–15.0)
TOTAL LYMPHOCYTE: 47.1 %
WBC: 4.4 10*3/uL (ref 3.8–10.8)
WBCMIX: 378 {cells}/uL (ref 200–950)

## 2017-10-24 LAB — TSH: TSH: 3.89 mIU/L (ref 0.40–4.50)

## 2017-10-25 ENCOUNTER — Ambulatory Visit
Admission: RE | Admit: 2017-10-25 | Discharge: 2017-10-25 | Disposition: A | Discharge status: Home / Self Care | Payer: Medicare Other | Source: Ambulatory Visit | Attending: Nurse Practitioner | Admitting: Nurse Practitioner

## 2017-10-25 ENCOUNTER — Other Ambulatory Visit: Payer: Self-pay | Admitting: Nurse Practitioner

## 2017-10-25 DIAGNOSIS — M5031 Other cervical disc degeneration,  high cervical region: Secondary | ICD-10-CM | POA: Diagnosis not present

## 2017-10-25 DIAGNOSIS — R202 Paresthesia of skin: Secondary | ICD-10-CM

## 2017-10-25 DIAGNOSIS — M5030 Other cervical disc degeneration, unspecified cervical region: Secondary | ICD-10-CM

## 2017-11-08 ENCOUNTER — Ambulatory Visit: Payer: Medicare Other | Admitting: Internal Medicine

## 2017-12-03 DIAGNOSIS — H401113 Primary open-angle glaucoma, right eye, severe stage: Secondary | ICD-10-CM | POA: Diagnosis not present

## 2017-12-25 ENCOUNTER — Encounter: Payer: Self-pay | Admitting: Internal Medicine

## 2017-12-31 ENCOUNTER — Ambulatory Visit: Payer: Medicare Other | Admitting: Internal Medicine

## 2018-01-01 ENCOUNTER — Ambulatory Visit (INDEPENDENT_AMBULATORY_CARE_PROVIDER_SITE_OTHER): Payer: Medicare Other

## 2018-01-01 ENCOUNTER — Ambulatory Visit (INDEPENDENT_AMBULATORY_CARE_PROVIDER_SITE_OTHER): Payer: Medicare Other | Admitting: Internal Medicine

## 2018-01-01 ENCOUNTER — Encounter: Payer: Self-pay | Admitting: Internal Medicine

## 2018-01-01 VITALS — BP 140/72 | HR 77 | Temp 97.6°F | Ht 63.0 in | Wt 164.0 lb

## 2018-01-01 VITALS — BP 145/72 | HR 77 | Temp 97.6°F | Ht 63.0 in | Wt 164.0 lb

## 2018-01-01 DIAGNOSIS — M5030 Other cervical disc degeneration, unspecified cervical region: Secondary | ICD-10-CM | POA: Diagnosis not present

## 2018-01-01 DIAGNOSIS — Z0000 Encounter for general adult medical examination without abnormal findings: Secondary | ICD-10-CM | POA: Diagnosis not present

## 2018-01-01 DIAGNOSIS — I10 Essential (primary) hypertension: Secondary | ICD-10-CM

## 2018-01-01 DIAGNOSIS — R7303 Prediabetes: Secondary | ICD-10-CM | POA: Diagnosis not present

## 2018-01-01 DIAGNOSIS — R202 Paresthesia of skin: Secondary | ICD-10-CM | POA: Diagnosis not present

## 2018-01-01 DIAGNOSIS — E782 Mixed hyperlipidemia: Secondary | ICD-10-CM

## 2018-01-01 DIAGNOSIS — Z79899 Other long term (current) drug therapy: Secondary | ICD-10-CM

## 2018-01-01 NOTE — Patient Instructions (Signed)
Ms. Wendy Yang , Thank you for taking time to come for your Medicare Wellness Visit. I appreciate your ongoing commitment to your health goals. Please review the following plan we discussed and let me know if I can assist you in the future.   Screening recommendations/referrals: Colonoscopy excluded, over age 82 Mammogram excluded, over age 82 Bone Density up to date Recommended yearly ophthalmology/optometry visit for glaucoma screening and checkup Recommended yearly dental visit for hygiene and checkup  Vaccinations: Influenza vaccine due, declined Pneumococcal vaccine possibly due. Please call insurance and ask if you got prevnar or pneumovax Tdap vaccine due. Please check cost with pharmacy Shingles vaccine due. Please check cost with pharmacy  Advanced directives: Please bring us a copy of your living will  Conditions/risks identified: none  Next appointment: Dr. Montez Moritaarter 01/01/2018 @ 10:30am             Tyron RussellSara Saunders, RN 01/05/2019 @ 10am   Preventive Care 65 Years and Older, Female Preventive care refers to lifestyle choices and visits with your health care provider that can promote health and wellness. What does preventive care include?  A yearly physical exam. This is also called an annual well check.  Dental exams once or twice a year.  Routine eye exams. Ask your health care provider how often you should have your eyes checked.  Personal lifestyle choices, including:  Daily care of your teeth and gums.  Regular physical activity.  Eating a healthy diet.  Avoiding tobacco and drug use.  Limiting alcohol use.  Practicing safe sex.  Taking low-dose aspirin every day.  Taking vitamin and mineral supplements as recommended by your health care provider. What happens during an annual well check? The services and screenings done by your health care provider during your annual well check will depend on your age, overall health, lifestyle risk factors, and family history  of disease. Counseling  Your health care provider may ask you questions about your:  Alcohol use.  Tobacco use.  Drug use.  Emotional well-being.  Home and relationship well-being.  Sexual activity.  Eating habits.  History of falls.  Memory and ability to understand (cognition).  Work and work Astronomerenvironment.  Reproductive health. Screening  You may have the following tests or measurements:  Height, weight, and BMI.  Blood pressure.  Lipid and cholesterol levels. These may be checked every 5 years, or more frequently if you are over 82 years old.  Skin check.  Lung cancer screening. You may have this screening every year starting at age 82 if you have a 30-pack-year history of smoking and currently smoke or have quit within the past 15 years.  Fecal occult blood test (FOBT) of the stool. You may have this test every year starting at age 82.  Flexible sigmoidoscopy or colonoscopy. You may have a sigmoidoscopy every 5 years or a colonoscopy every 10 years starting at age 82.  Hepatitis C blood test.  Hepatitis B blood test.  Sexually transmitted disease (STD) testing.  Diabetes screening. This is done by checking your blood sugar (glucose) after you have not eaten for a while (fasting). You may have this done every 1-3 years.  Bone density scan. This is done to screen for osteoporosis. You may have this done starting at age 82.  Mammogram. This may be done every 1-2 years. Talk to your health care provider about how often you should have regular mammograms. Talk with your health care provider about your test results, treatment options, and if necessary, the  need for more tests. Vaccines  Your health care provider may recommend certain vaccines, such as:  Influenza vaccine. This is recommended every year.  Tetanus, diphtheria, and acellular pertussis (Tdap, Td) vaccine. You may need a Td booster every 10 years.  Zoster vaccine. You may need this after age  25.  Pneumococcal 13-valent conjugate (PCV13) vaccine. One dose is recommended after age 61.  Pneumococcal polysaccharide (PPSV23) vaccine. One dose is recommended after age 7. Talk to your health care provider about which screenings and vaccines you need and how often you need them. This information is not intended to replace advice given to you by your health care provider. Make sure you discuss any questions you have with your health care provider. Document Released: 05/20/2015 Document Revised: 01/11/2016 Document Reviewed: 02/22/2015 Elsevier Interactive Patient Education  2017 [place] Prevention in the Home Falls can cause injuries. They can happen to people of all ages. There are many things you can do to make your home safe and to help prevent falls. What can I do on the outside of my home?  Regularly fix the edges of walkways and driveways and fix any cracks.  Remove anything that might make you trip as you walk through a door, such as a raised step or threshold.  Trim any bushes or trees on the path to your home.  Use bright outdoor lighting.  Clear any walking paths of anything that might make someone trip, such as rocks or tools.  Regularly check to see if handrails are loose or broken. Make sure that both sides of any steps have handrails.  Any raised decks and porches should have guardrails on the edges.  Have any leaves, snow, or ice cleared regularly.  Use sand or salt on walking paths during winter.  Clean up any spills in your garage right away. This includes oil or grease spills. What can I do in the bathroom?  Use night lights.  Install grab bars by the toilet and in the tub and shower. Do not use towel bars as grab bars.  Use non-skid mats or decals in the tub or shower.  If you need to sit down in the shower, use a plastic, non-slip stool.  Keep the floor dry. Clean up any water that spills on the floor as soon as it happens.  Remove  soap buildup in the tub or shower regularly.  Attach bath mats securely with double-sided non-slip rug tape.  Do not have throw rugs and other things on the floor that can make you trip. What can I do in the bedroom?  Use night lights.  Make sure that you have a light by your bed that is easy to reach.  Do not use any sheets or blankets that are too big for your bed. They should not hang down onto the floor.  Have a firm chair that has side arms. You can use this for support while you get dressed.  Do not have throw rugs and other things on the floor that can make you trip. What can I do in the kitchen?  Clean up any spills right away.  Avoid walking on wet floors.  Keep items that you use a lot in easy-to-reach places.  If you need to reach something above you, use a strong step stool that has a grab bar.  Keep electrical cords out of the way.  Do not use floor polish or wax that makes floors slippery. If you must use wax,  use non-skid floor wax.  Do not have throw rugs and other things on the floor that can make you trip. What can I do with my stairs?  Do not leave any items on the stairs.  Make sure that there are handrails on both sides of the stairs and use them. Fix handrails that are broken or loose. Make sure that handrails are as long as the stairways.  Check any carpeting to make sure that it is firmly attached to the stairs. Fix any carpet that is loose or worn.  Avoid having throw rugs at the top or bottom of the stairs. If you do have throw rugs, attach them to the floor with carpet tape.  Make sure that you have a light switch at the top of the stairs and the bottom of the stairs. If you do not have them, ask someone to add them for you. What else can I do to help prevent falls?  Wear shoes that:  Do not have high heels.  Have rubber bottoms.  Are comfortable and fit you well.  Are closed at the toe. Do not wear sandals.  If you use a  stepladder:  Make sure that it is fully opened. Do not climb a closed stepladder.  Make sure that both sides of the stepladder are locked into place.  Ask someone to hold it for you, if possible.  Clearly mark and make sure that you can see:  Any grab bars or handrails.  First and last steps.  Where the edge of each step is.  Use tools that help you move around (mobility aids) if they are needed. These include:  Canes.  Walkers.  Scooters.  Crutches.  Turn on the lights when you go into a dark area. Replace any light bulbs as soon as they burn out.  Set up your furniture so you have a clear path. Avoid moving your furniture around.  If any of your floors are uneven, fix them.  If there are any pets around you, be aware of where they are.  Review your medicines with your doctor. Some medicines can make you feel dizzy. This can increase your chance of falling. Ask your doctor what other things that you can do to help prevent falls. This information is not intended to replace advice given to you by your health care provider. Make sure you discuss any questions you have with your health care provider. Document Released: 02/17/2009 Document Revised: 09/29/2015 Document Reviewed: 05/28/2014 Elsevier Interactive Patient Education  2017 [employer].

## 2018-01-01 NOTE — Progress Notes (Signed)
Subjective:   Wendy Yang is a 84 y.o. female who presents for Medicare Annual (Subsequent) preventive examination.  Last AWV-11/15/2016    Objective:     Vitals: BP (!) 145/72 (BP Location: Right Arm, Patient Position: Sitting)   Pulse 77   Temp 97.6 F (36.4 C) (Oral)   Ht 5\' 3"  (1.6 m)   Wt 164 lb (74.4 kg)   SpO2 97%   BMI 29.05 kg/m   Body mass index is 29.05 kg/m.  Provider notified of BP  Advanced Directives 01/01/2018 10/22/2017 01/09/2017 11/22/2016 11/16/2016 11/16/2016 11/15/2016  Does Patient Have a Medical Advance Directive? Yes No No No No No No  Type of Advance Directive Living Wendy - - - - - -  Does patient want to make changes to medical advance directive? No - Patient declined - - - No - Patient declined No - Patient declined No - Patient declined  Copy of Healthcare Power of Attorney in Chart? - - - - - - -  Would patient like information on creating a medical advance directive? - - - - No - Patient declined - -    Tobacco Social History   Tobacco Use  Smoking Status Never Smoker  Smokeless Tobacco Never Used     Counseling given: Not Answered   Clinical Intake:  Pre-visit preparation completed: No  Pain : 0-10 Pain Score: 3  Pain Type: Chronic pain Pain Location: Shoulder Pain Orientation: Left Pain Radiating Towards: left elbow Pain Descriptors / Indicators: Aching, Sharp Pain Onset: More than a month ago Pain Frequency: Intermittent     Nutritional Risks: None Diabetes: Yes CBG done?: No Did pt. bring in CBG monitor from home?: No  How often do you need to have someone help you when you read instructions, pamphlets, or other written materials from your doctor or pharmacy?: 2 - Rarely What is the last grade level you completed in school?: 8th grade  Interpreter Needed?: No  Information entered by :: Wendy Russell, RN  Past Medical History:  Diagnosis Date  . Bifascicular block 12/03/2016  . Diabetes mellitus without  complication (HCC)   . Hypertension   . Mobitz I 12/03/2016   History reviewed. No pertinent surgical history. Family History  Problem Relation Age of Onset  . Heart disease Mother   . Heart attack Mother   . Cancer Brother   . Breast cancer Daughter   . Breast cancer Maternal Aunt   . Breast cancer Cousin    Social History   Socioeconomic History  . Marital status: Divorced    Spouse name: Not on file  . Number of children: Not on file  . Years of education: Not on file  . Highest education level: Not on file  Occupational History  . Not on file  Social Needs  . Financial resource strain: Not hard at all  . Food insecurity:    Worry: Never true    Inability: Never true  . Transportation needs:    Medical: No    Non-medical: No  Tobacco Use  . Smoking status: Never Smoker  . Smokeless tobacco: Never Used  Substance and Sexual Activity  . Alcohol use: No    Alcohol/week: 0.0 standard drinks  . Drug use: No  . Sexual activity: Not Currently  Lifestyle  . Physical activity:    Days per week: 0 days    Minutes per session: 0 min  . Stress: Only a little  Relationships  . Social connections:  Talks on phone: More than three times a week    Gets together: More than three times a week    Attends religious service: More than 4 times per year    Active member of club or organization: No    Attends meetings of clubs or organizations: Never    Relationship status: Divorced  Other Topics Concern  . Not on file  Social History Narrative   Diet:    Do you drink/eat things with caffeine? Coffee   Marital status: Divorced                        What year were you married? 1946   Do you live in a house, apartment, assisted living, condo, trailer, etc)? House   Is it one or more stories?  One   How many persons live in your home? 1   Do you have any pets in your home? No   Current or past profession: Food Packer   Do you exercise?  Yes                                                    Type & how often: Walk, yard work    Do you have a living Wendy?    Do you have a DNR Form?   Do you have a POA/HPOA forms?          Outpatient Encounter Medications as of 01/01/2018  Medication Sig  . acetaminophen (TYLENOL) 325 MG tablet Take 325 mg by mouth daily as needed for mild pain or moderate pain.  . naproxen (NAPROSYN) 250 MG tablet Take 250 mg by mouth daily.  Marland Kitchen. losartan (COZAAR) 25 MG tablet Take 1 tablet (25 mg total) by mouth daily.   No facility-administered encounter medications on file as of 01/01/2018.     Activities of Daily Living In your present state of health, do you have any difficulty performing the following activities: 01/01/2018  Hearing? N  Vision? N  Difficulty concentrating or making decisions? N  Walking or climbing stairs? N  Dressing or bathing? N  Doing errands, shopping? N  Preparing Food and eating ? N  Using the Toilet? N  In the past six months, have you accidently leaked urine? N  Do you have problems with loss of bowel control? N  Managing your Medications? N  Managing your Finances? N  Housekeeping or managing your Housekeeping? N  Some recent data might be hidden    Patient Care Team: Kirt Yang, Monica, DO as PCP - General (Internal Medicine) Regan Yang, Wendy Martin, MD as Consulting Physician (Cardiology)    Assessment:   This is a routine wellness examination for [facility].  Exercise Activities and Dietary recommendations Current Exercise Habits: The patient does not participate in regular exercise at present, Exercise limited by: None identified  Goals    . Maintain Lifestyle     Pt Wendy maintain lifestyle.        Fall Risk Fall Risk  01/01/2018 10/22/2017 08/28/2017 07/29/2017 02/26/2017  Falls in the past year? No No No No No   Is the patient's home free of loose throw rugs in walkways, pet beds, electrical cords, etc?   yes      Grab bars in the bathroom? yes      Handrails on the stairs?  yes      Adequate  lighting?   yes  Depression Screen PHQ 2/9 Scores 01/01/2018 11/16/2016 11/15/2016 12/01/2014  PHQ - 2 Score 0 0 0 0     Cognitive Function MMSE - Mini Mental State Exam 01/01/2018 11/15/2016  Orientation to time 5 4  Orientation to Place 5 5  Registration 3 3  Attention/ Calculation 0 1  Recall 3 2  Language- name 2 objects 2 2  Language- repeat 1 1  Language- follow 3 step command 3 2  Language- read & follow direction 1 1  Write a sentence 1 1  Copy design 0 1  Total score 24 23        Immunization History  Administered Date(s) Administered  . Hepatitis A 05/05/2012  . Influenza,inj,Quad PF,6+ Mos 03/06/2013, 03/30/2015    Qualifies for Shingles Vaccine? Due, patient Wendy check cost with pharmacy  Screening Tests Health Maintenance  Topic Date Due  . FOOT EXAM  02/10/2015  . OPHTHALMOLOGY EXAM  10/23/2017  . INFLUENZA VACCINE  05/08/2023 (Originally 12/05/2017)  . PNA vac Low Risk Adult (1 of 2 - PCV13) 11/17/2025 (Originally 06/28/1992)  . TETANUS/TDAP  11/19/2029 (Originally 06/28/1944)  . HEMOGLOBIN A1C  02/27/2018  . DEXA SCAN  Completed    Cancer Screenings: Lung: Low Dose CT Chest recommended if Age 65-80 years, 30 pack-year currently smoking OR have quit w/in 15years. Patient does not qualify. Breast:  Up to date on Mammogram? Yes   Up to date of Bone Density/Dexa? Yes Colorectal: up to date  Additional Screenings:  Hepatitis C Screening: declined Flu vaccine due: declined TDAP due: patent Wendy check cost with pharmacy     Plan:    I have personally reviewed and addressed the Medicare Annual Wellness questionnaire and have noted the following in the patient's chart:  A. Medical and social history B. Use of alcohol, tobacco or illicit drugs  C. Current medications and supplements D. Functional ability and status E.  Nutritional status F.  Physical activity G. Advance directives H. List of other physicians I.  Hospitalizations, surgeries, and ER visits  in previous 12 months J.  Vitals K. Screenings to include hearing, vision, cognitive, depression L. Referrals and appointments - none  In addition, I have reviewed and discussed with patient certain preventive protocols, quality metrics, and best practice recommendations. A written personalized care plan for preventive services as well as general preventive health recommendations were provided to patient.  See attached scanned questionnaire for additional information.   Signed,   Wendy Russell, RN Nurse Health Advisor  Patient Concerns: None

## 2018-01-01 NOTE — Progress Notes (Signed)
Patient ID: Jennette BankerMargaret H Timothy, female   DOB: 1925-10-29, 84 y.o.   MRN: [id number]   [facility]  Provider: DR Elmon KirschnerMONICA S Gennifer Potenza  Code Status:  Goals of Care:  Advanced Directives 01/01/2018  Does Patient Have a Medical Advance Directive? Yes  Type of Advance Directive Living will  Does patient want to make changes to medical advance directive? No - Patient declined  Copy of Healthcare Power of Attorney in Chart? -  Would patient like information on creating a medical advance directive? -     Chief Complaint  Patient presents with  . Medical Management of Chronic Issues    4mth follow-up    HPI: Patient is a 84 y.o. female seen today for medical management of chronic diseases.  She c/o pain in left shoulder >> left elbow with associated numbness in fingers when her elbow is resting on tabletop or another object. Occasionally has "soreness" left ear. She noticed OS muscle twitching x 1 week. She has chronic rhinorrhea but no other sinus sx's. She was seen in June 2019 for similar c/o of shoulder pain. C spine xray revealed mulitlevel severe DDD with bone spurs and mild narrowing of neural foramina. She was referred to neurosx but decided not to pursue further management as her sx's improved. She now reconsiders.   She has seen ortho in the past for right rotator cuff and was told she needed an injection but declined that tx as well.  She reports no issues with right shoulder at this time.  She ate a canteloupe about 1 month ago but developed a rash on her abdomen and has not had any since.  HTN - controlled on amlodipine. Takes ASA daily  Bradycardia - Mobitz I/bifascicular block. Unchanged. No palpitations. Followed by cardio Dr Elberta Fortisamnitz annually (due in Apr 2020)  Prediabetes - stable. A1c 5.8%  Hyperlipidemia - diet controlled. LDL 142; Tchol 230  hiatal hernia - she reports frequent heartburn and she keeps "swallowing and swallowing" which helps. No recent GI w/u. She  drinks celery juice which helps with constipation  Past Medical History:  Diagnosis Date  . Bifascicular block 12/03/2016  . Diabetes mellitus without complication (HCC)   . Hypertension   . Mobitz I 12/03/2016    History reviewed. No pertinent surgical history.   reports that she has never smoked. She has never used smokeless tobacco. She reports that she does not drink alcohol or use drugs. Social History   Socioeconomic History  . Marital status: Divorced    Spouse name: Not on file  . Number of children: Not on file  . Years of education: Not on file  . Highest education level: Not on file  Occupational History  . Not on file  Social Needs  . Financial resource strain: Not hard at all  . Food insecurity:    Worry: Never true    Inability: Never true  . Transportation needs:    Medical: No    Non-medical: No  Tobacco Use  . Smoking status: Never Smoker  . Smokeless tobacco: Never Used  Substance and Sexual Activity  . Alcohol use: No    Alcohol/week: 0.0 standard drinks  . Drug use: No  . Sexual activity: Not Currently  Lifestyle  . Physical activity:    Days per week: 0 days    Minutes per session: 0 min  . Stress: Only a little  Relationships  . Social connections:    Talks on phone: More than three times  a week    Gets together: More than three times a week    Attends religious service: More than 4 times per year    Active member of club or organization: No    Attends meetings of clubs or organizations: Never    Relationship status: Divorced  . Intimate partner violence:    Fear of current or ex partner: No    Emotionally abused: No    Physically abused: No    Forced sexual activity: No  Other Topics Concern  . Not on file  Social History Narrative   Diet:    Do you drink/eat things with caffeine? Coffee   Marital status: Divorced                        What year were you married? 1946   Do you live in a house, apartment, assisted living, condo,  trailer, etc)? House   Is it one or more stories?  One   How many persons live in your home? 1   Do you have any pets in your home? No   Current or past profession: Food Packer   Do you exercise?  Yes                                                   Type & how often: Walk, yard work    Do you have a living will?    Do you have a DNR Form?   Do you have a POA/HPOA forms?          Family History  Problem Relation Age of Onset  . Heart disease Mother   . Heart attack Mother   . Cancer Brother   . Breast cancer Daughter   . Breast cancer Maternal Aunt   . Breast cancer Cousin     No Known Allergies  Outpatient Encounter Medications as of 01/01/2018  Medication Sig  . acetaminophen (TYLENOL) 325 MG tablet Take 325 mg by mouth daily as needed for mild pain or moderate pain.  Marland Kitchen losartan (COZAAR) 25 MG tablet Take 1 tablet (25 mg total) by mouth daily.  . naproxen (NAPROSYN) 250 MG tablet Take 250 mg by mouth daily.   No facility-administered encounter medications on file as of 01/01/2018.     Review of Systems:  Review of Systems  HENT: Positive for ear pain and rhinorrhea.   Musculoskeletal: Positive for arthralgias.  Neurological: Positive for numbness.  All other systems reviewed and are negative.   Health Maintenance  Topic Date Due  . FOOT EXAM  02/10/2015  . OPHTHALMOLOGY EXAM  10/23/2017  . INFLUENZA VACCINE  05/08/2023 (Originally 12/05/2017)  . PNA vac Low Risk Adult (1 of 2 - PCV13) 11/17/2025 (Originally 06/28/1992)  . TETANUS/TDAP  11/19/2029 (Originally 06/28/1944)  . HEMOGLOBIN A1C  02/27/2018  . DEXA SCAN  Completed    Physical Exam: Vitals:   01/01/18 1004  BP: 140/72  Pulse: 77  Temp: 97.6 F (36.4 C)  TempSrc: Oral  SpO2: 97%  Weight: 164 lb (74.4 kg)  Height: 5\' 3"  (1.6 m)   Body mass index is 29.05 kg/m. Physical Exam  Constitutional: She is oriented to person, place, and time. She appears well-developed and well-nourished.  HENT:    Mouth/Throat: Oropharynx is clear and moist. No oropharyngeal exudate.  MMM; no oral thrush  Eyes: Pupils are equal, round, and reactive to light. No scleral icterus.  Neck: Neck supple. Muscular tenderness present. Carotid bruit is not present. Decreased range of motion present. No tracheal deviation present. No thyromegaly present.    Cardiovascular: Normal rate, regular rhythm and intact distal pulses. Exam reveals no gallop and no friction rub.  Murmur (1/6 SEM) heard. No LE edema b/l. no calf TTP.   Pulmonary/Chest: Effort normal and breath sounds normal. No stridor. No respiratory distress. She has no wheezes. She has no rales.  Abdominal: Soft. Normal appearance and bowel sounds are normal. She exhibits no distension and no mass. There is no hepatomegaly. There is no tenderness. There is no rigidity, no rebound and no guarding. No hernia.  Musculoskeletal: She exhibits edema and tenderness.       Right shoulder: She exhibits decreased range of motion and spasm.       Left shoulder: She exhibits decreased range of motion, tenderness, swelling, crepitus and spasm. She exhibits normal strength.       Arms: Lymphadenopathy:    She has no cervical adenopathy.  Neurological: She is alert and oriented to person, place, and time. She has normal reflexes.  Strength intact  Skin: Skin is warm and dry. No rash noted.  Psychiatric: She has a normal mood and affect. Her behavior is normal. Judgment and thought content normal.    Labs reviewed: Basic Metabolic Panel: Recent Labs    05/28/17 1135 08/28/17 1140 10/23/17 1205  NA 143 142 141  K 4.7 4.8 4.8  CL 108 107 106  CO2 28 29 28   GLUCOSE 109* 102* 90  BUN 15 12 17   CREATININE 1.12* 1.11* 1.05*  CALCIUM 9.9 9.4 9.5  TSH  --   --  3.89   Liver Function Tests: Recent Labs    07/01/17 1023 08/28/17 1140  AST  --  14  ALT 9 7  BILITOT  --  0.6  PROT  --  6.7   No results for input(s): LIPASE, AMYLASE in the last 8760  hours. No results for input(s): AMMONIA in the last 8760 hours. CBC: Recent Labs    10/23/17 1205  WBC 4.4  NEUTROABS 1,870  HGB 11.6*  HCT 34.9*  MCV 91.4  PLT 206   Lipid Panel: Recent Labs    05/28/17 1135 07/01/17 1023 08/28/17 1140  CHOL 284* 174 230*  HDL 96 78 70  LDLCALC 171* 82 142*  TRIG 71 59 74  CHOLHDL 3.0 2.2 3.3   Lab Results  Component Value Date   HGBA1C 5.8 (H) 08/28/2017    Procedures since last visit: No results found.  Assessment/Plan   ICD-10-CM   1. DDD (degenerative disc disease), cervical M50.30 Ambulatory referral to Neurosurgery  2. Paresthesia of arm R20.2 Ambulatory referral to Neurosurgery  3. Prediabetes R73.03 Hemoglobin A1c  4. Essential hypertension I10   5. Mixed hyperlipidemia E78.2 Lipid Panel  6. High risk medication use Z79.899    Continue current medications as ordered  Will call with Neurosurgery referral  Will call with lab results  Follow up with specialists as scheduled  FLU SHOT GIVEN TODAY  Follow up in 4mos with Jessica for prediabetes and hyperlipidemia, HTN    Shonnie Poudrier S. Ancil Linsey  [facility] [address] [place], [place] [address] [phone] Cell (Monday-Friday 8 AM - 5 PM) [phone] After 5 PM and follow prompts

## 2018-01-01 NOTE — Patient Instructions (Addendum)
Continue current medications as ordered  Will call with Neurosurgery referral  Will call with lab results  Follow up with specialists as scheduled  FLU SHOT GIVEN TODAY  Follow up in 4mos with Jessica for prediabetes and hyperlipidemia, HTN

## 2018-01-02 LAB — LIPID PANEL
Cholesterol: 261 mg/dL — ABNORMAL HIGH (ref <200)
HDL: 74 mg/dL (ref >50)
LDL Cholesterol (Calc): 171 mg/dL (calc) — ABNORMAL HIGH
NON-HDL CHOLESTEROL (CALC): 187 mg/dL — AB (ref <130)
Total CHOL/HDL Ratio: 3.5 (calc) (ref <5.0)
Triglycerides: 67 mg/dL (ref <150)

## 2018-01-02 LAB — HEMOGLOBIN A1C
EAG (MMOL/L): 7 (calc)
Hgb A1c MFr Bld: 6 % of total Hgb — ABNORMAL HIGH (ref <5.7)
Mean Plasma Glucose: 126 (calc)

## 2018-01-03 ENCOUNTER — Other Ambulatory Visit: Payer: Self-pay

## 2018-01-03 ENCOUNTER — Telehealth: Payer: Self-pay

## 2018-01-03 DIAGNOSIS — E782 Mixed hyperlipidemia: Secondary | ICD-10-CM

## 2018-01-03 MED ORDER — ROSUVASTATIN CALCIUM 5 MG PO TABS: 5.0000 mg | ORAL_TABLET | Freq: Every day | ORAL | 6 refills | Status: DC

## 2018-01-03 NOTE — Telephone Encounter (Signed)
Medication list has been updated and rx was sent to pharmacy.  

## 2018-01-03 NOTE — Telephone Encounter (Signed)
-----   Message from [place]ica [place], [place] sent at 01/02/2018  9:25 PM EDT ----- crestor 5 mg #30 take 1 tab po qhs with 6RF; reck fasting lipid panel and ALT in 1 month

## 2018-01-24 DIAGNOSIS — H401113 Primary open-angle glaucoma, right eye, severe stage: Secondary | ICD-10-CM | POA: Diagnosis not present

## 2018-01-24 DIAGNOSIS — H401121 Primary open-angle glaucoma, left eye, mild stage: Secondary | ICD-10-CM | POA: Diagnosis not present

## 2018-02-03 ENCOUNTER — Other Ambulatory Visit: Payer: Self-pay

## 2018-02-04 ENCOUNTER — Other Ambulatory Visit: Payer: Medicare Other

## 2018-02-04 DIAGNOSIS — E782 Mixed hyperlipidemia: Secondary | ICD-10-CM

## 2018-02-04 LAB — LIPID PANEL
CHOLESTEROL: 170 mg/dL (ref <200)
HDL: 72 mg/dL (ref >50)
LDL Cholesterol (Calc): 85 mg/dL (calc)
Non-HDL Cholesterol (Calc): 98 mg/dL (calc) (ref <130)
Total CHOL/HDL Ratio: 2.4 (calc) (ref <5.0)
Triglycerides: 46 mg/dL (ref <150)

## 2018-02-04 LAB — ALT: ALT: 9 U/L (ref 6–29)

## 2018-04-23 DIAGNOSIS — H401113 Primary open-angle glaucoma, right eye, severe stage: Secondary | ICD-10-CM | POA: Diagnosis not present

## 2018-05-05 ENCOUNTER — Encounter: Payer: Self-pay | Admitting: Nurse Practitioner

## 2018-05-05 ENCOUNTER — Ambulatory Visit (INDEPENDENT_AMBULATORY_CARE_PROVIDER_SITE_OTHER): Payer: Medicare Other | Admitting: Nurse Practitioner

## 2018-05-05 VITALS — BP 140/82 | HR 68 | Temp 97.7°F | Ht 63.0 in | Wt 169.0 lb

## 2018-05-05 DIAGNOSIS — Z23 Encounter for immunization: Secondary | ICD-10-CM | POA: Diagnosis not present

## 2018-05-05 DIAGNOSIS — R7303 Prediabetes: Secondary | ICD-10-CM

## 2018-05-05 DIAGNOSIS — I10 Essential (primary) hypertension: Secondary | ICD-10-CM | POA: Diagnosis not present

## 2018-05-05 DIAGNOSIS — E782 Mixed hyperlipidemia: Secondary | ICD-10-CM

## 2018-05-05 DIAGNOSIS — M25511 Pain in right shoulder: Secondary | ICD-10-CM

## 2018-05-05 DIAGNOSIS — M25512 Pain in left shoulder: Secondary | ICD-10-CM

## 2018-05-05 DIAGNOSIS — G8929 Other chronic pain: Secondary | ICD-10-CM

## 2018-05-05 NOTE — Patient Instructions (Addendum)
To use tylenol 325mg  1-2 tablets every 6 hours as needed pain Okay to use aleve 220 mg by mouth daily if needed on the rare occasion (hard on kidneys, heart and stomach)  Follow up in 3 months, will get fasting blood work at that time

## 2018-05-05 NOTE — Progress Notes (Signed)
Careteam: Patient Care Team: Kirt Boysarter, Monica, DO as PCP - General (Internal Medicine) Regan Lemmingamnitz, Will Martin, MD as Consulting Physician (Cardiology)  Advanced Directive information Does Patient Have a Medical Advance Directive?: No, Would patient like information on creating a medical advance directive?: No - Patient declined  No Known Allergies  Chief Complaint  Patient presents with  . Medical Management of Chronic Issues    4 month follow up for prediabetes,hyperlipidemia.HTN.had eye done 2 weeks ago     HPI: Patient is a 84 y.o. female seen in the office today for routine follow up.   Right shoulder pain- has seen ortho in the past for right rotator cuff and was told she needed an injection but declined that tx as well.  She reports no issues with right shoulder at this time, takes an aleve occasionally if she needs it   HTN - not on any medication at this time, using herbs.   Bradycardia - Mobitz I/bifascicular block. Unchanged. No palpitations. Followed by cardio Dr Elberta Fortisamnitz annually (due in Apr 2020)   Prediabetes - stable. A1c 6.0% diet controlled   Hyperlipidemia - LDL was elevated and she took Crestor for a month and then stopped, attempting to follow a low cholesterol diet. No side effects from crestor just does not like to be on medication.   hiatal hernia -hx of heartburn but none currently.   Review of Systems:  Review of Systems  Constitutional: Negative for chills, diaphoresis, fever, malaise/fatigue and weight loss.  Eyes: Negative for blurred vision.  Respiratory: Negative for cough and shortness of breath.   Cardiovascular: Negative for chest pain and palpitations.  Musculoskeletal: Positive for myalgias. Negative for falls and joint pain.       Pain to shoulders occasional  Neurological: Negative for dizziness, tingling and headaches.  Psychiatric/Behavioral: Negative for depression. The patient is not nervous/anxious and does not have insomnia.      Past Medical History:  Diagnosis Date  . Bifascicular block 12/03/2016  . Diabetes mellitus without complication (HCC)   . Hypertension   . Mobitz I 12/03/2016   No past surgical history on file. Social History:   reports that she has never smoked. She has never used smokeless tobacco. She reports that she does not drink alcohol or use drugs.  Family History  Problem Relation Age of Onset  . Heart disease Mother   . Heart attack Mother   . Cancer Brother   . Breast cancer Daughter   . Breast cancer Maternal Aunt   . Breast cancer Cousin     Medications: Patient's Medications  New Prescriptions   No medications on file  Previous Medications   LOSARTAN (COZAAR) 25 MG TABLET    Take 1 tablet (25 mg total) by mouth daily.   NAPROXEN (NAPROSYN) 250 MG TABLET    Take 250 mg by mouth daily.   ROSUVASTATIN (CRESTOR) 5 MG TABLET    Take 1 tablet (5 mg total) by mouth at bedtime.  Modified Medications   No medications on file  Discontinued Medications   ACETAMINOPHEN (TYLENOL) 325 MG TABLET    Take 325 mg by mouth daily as needed for mild pain or moderate pain.     Physical Exam:  Vitals:   05/05/18 1405 05/05/18 1437  BP: (!) 150/88 140/82  Pulse: 68   Temp: 97.7 F (36.5 C)   TempSrc: Oral   SpO2: 94%   Weight: 169 lb (76.7 kg)   Height: 5\' 3"  (1.6 m)  Body mass index is 29.94 kg/m.  Physical Exam Constitutional:      General: She is not in acute distress.    Appearance: She is well-developed. She is not diaphoretic.     Comments: Appears younger than age  HENT:     Head: Normocephalic and atraumatic.     Mouth/Throat:     Pharynx: No oropharyngeal exudate.  Eyes:     Conjunctiva/sclera: Conjunctivae normal.     Pupils: Pupils are equal, round, and reactive to light.  Neck:     Musculoskeletal: Normal range of motion and neck supple.  Cardiovascular:     Rate and Rhythm: Normal rate and regular rhythm.     Heart sounds: Normal heart sounds.   Pulmonary:     Effort: Pulmonary effort is normal.     Breath sounds: Normal breath sounds.  Abdominal:     General: Bowel sounds are normal.     Palpations: Abdomen is soft.  Musculoskeletal:        General: No tenderness.  Skin:    General: Skin is warm and dry.  Neurological:     Mental Status: She is alert and oriented to person, place, and time.     Labs reviewed: Basic Metabolic Panel: Recent Labs    05/28/17 1135 08/28/17 1140 10/23/17 1205  NA 143 142 141  K 4.7 4.8 4.8  CL 108 107 106  CO2 28 29 28   GLUCOSE 109* 102* 90  BUN 15 12 17   CREATININE 1.12* 1.11* 1.05*  CALCIUM 9.9 9.4 9.5  TSH  --   --  3.89   Liver Function Tests: Recent Labs    07/01/17 1023 08/28/17 1140 02/04/18 0929  AST  --  14  --   ALT 9 7 9   BILITOT  --  0.6  --   PROT  --  6.7  --    No results for input(s): LIPASE, AMYLASE in the last 8760 hours. No results for input(s): AMMONIA in the last 8760 hours. CBC: Recent Labs    10/23/17 1205  WBC 4.4  NEUTROABS 1,870  HGB 11.6*  HCT 34.9*  MCV 91.4  PLT 206   Lipid Panel: Recent Labs    08/28/17 1140 01/01/18 1122 02/04/18 0929  CHOL 230* 261* 170  HDL 70 74 72  LDLCALC 142* 171* 85  TRIG 74 67 46  CHOLHDL 3.3 3.5 2.4   TSH: Recent Labs    10/23/17 1205  TSH 3.89   A1C: Lab Results  Component Value Date   HGBA1C 6.0 (H) 01/01/2018     Assessment/Plan 1. Prediabetes -diet controlled, continues lifestyle modifications.   2. Mixed hyperlipidemia -took crestor for 1 month and stopped, LDL improved however not currently on medications, will recheck cholesterol at next OV (not fasting today)  3. Essential hypertension -improved on recheck. Continues with dietary modifications.   4. Chronic pain of both shoulders Stable, uses aleve occasionally if needed. Does not effect ADLS. Encouraged to use aleve rarely and to use tylenol as needed   5. Need for vaccination with 13-polyvalent pneumococcal conjugate  vaccine - Pneumococcal conjugate vaccine 13-valent IM  Next appt: 3 months.  Wendy HarveyJessica K. Biagio BorgEubanks, AGNP  [facility] [phone]

## 2018-05-08 DIAGNOSIS — H401113 Primary open-angle glaucoma, right eye, severe stage: Secondary | ICD-10-CM | POA: Diagnosis not present

## 2018-08-04 ENCOUNTER — Encounter: Payer: Self-pay | Admitting: Nurse Practitioner

## 2018-08-04 ENCOUNTER — Other Ambulatory Visit: Payer: Self-pay

## 2018-08-04 ENCOUNTER — Ambulatory Visit (INDEPENDENT_AMBULATORY_CARE_PROVIDER_SITE_OTHER): Payer: Medicare Other | Admitting: Nurse Practitioner

## 2018-08-04 DIAGNOSIS — E2839 Other primary ovarian failure: Secondary | ICD-10-CM

## 2018-08-04 DIAGNOSIS — E782 Mixed hyperlipidemia: Secondary | ICD-10-CM

## 2018-08-04 DIAGNOSIS — M25512 Pain in left shoulder: Secondary | ICD-10-CM

## 2018-08-04 DIAGNOSIS — M25511 Pain in right shoulder: Secondary | ICD-10-CM

## 2018-08-04 DIAGNOSIS — R7303 Prediabetes: Secondary | ICD-10-CM | POA: Diagnosis not present

## 2018-08-04 DIAGNOSIS — I10 Essential (primary) hypertension: Secondary | ICD-10-CM | POA: Diagnosis not present

## 2018-08-04 DIAGNOSIS — G8929 Other chronic pain: Secondary | ICD-10-CM

## 2018-08-04 NOTE — Patient Instructions (Signed)
Recommended to take caltrate with D 600/400 twice a day.   Order placed for bone density for screening for osteoporosis  Monitor blood pressure. GOAL BP is less than 140/90.

## 2018-08-04 NOTE — Progress Notes (Signed)
This service is provided via telemedicine  No vital signs collected/recorded due to the encounter was a telemedicine visit.   Location of patient (ex: home, work): Home  Patient consents to a telephone visit:  Yes  Location of the provider (ex: office, home):  [facility]  Name of any referring provider: Abbey Chatters, NP  Names of all persons participating in the telemedicine service and their role in the encounter:  S.Chrae B/CMA, Abbey Chatters, NP, and patient  Time spent on call:  4 min with CMA  Virtual Visit via Telephone Note  I connected with Wendy Yang on 08/04/18 at  9:00 AM EDT by telephone and verified that I am speaking with the correct person using two identifiers.   I discussed the limitations, risks, security and privacy concerns of performing an evaluation and management service by telephone and the availability of in person appointments. I also discussed with the patient that there may be a patient responsible charge related to this service. The patient expressed understanding and agreed to proceed.     Careteam: Patient Care Team: Kirt Boys, DO as PCP - General (Internal Medicine) Regan Lemming, MD as Consulting Physician (Cardiology)  Advanced Directive information    No Known Allergies  Chief Complaint  Patient presents with  . Medical Management of Chronic Issues    3 month follow-up      HPI: Patient is a 85 y.o. female to discuss chronic conditions.  Not currently taking any medication, uses herbs.   HTN - previously on amlodipine now not currently taking medications. Using herbs to help this. Attempts low sodium diet.   Bradycardia - Mobitz I/bifascicular block. . No palpitations. Followed by cardio Dr Elberta Fortis annually (due in Apr 2020- will follow up after COVID-19 gets better)  Prediabetes - stable. A1c 5.8% diet controlled.   Hyperlipidemia-previously prescribed crestor, currently not taking, taking  herbs for this. Uses a tea.   hiatal hernia - hx of heartburn, none currently.   Chronic left shoulder pain- uses aspercream and exercises.  Aches, pain is generally not that bad but if she does something awkward then it will exacerbate pain.    Review of Systems:  Review of Systems  Constitutional: Negative for chills, diaphoresis, fever, malaise/fatigue and weight loss.  Eyes: Negative for blurred vision.  Respiratory: Negative for cough and shortness of breath.   Cardiovascular: Negative for chest pain and palpitations.  Musculoskeletal: Positive for myalgias. Negative for falls and joint pain.       Pain to left shoulder blade area. - ongoing, ache  Neurological: Negative for dizziness and headaches.  Psychiatric/Behavioral: Negative for depression. The patient is not nervous/anxious and does not have insomnia.     Past Medical History:  Diagnosis Date  . Bifascicular block 12/03/2016  . Diabetes mellitus without complication (HCC)   . Hypertension   . Mobitz I 12/03/2016   History reviewed. No pertinent surgical history. Social History:   reports that she has never smoked. She has never used smokeless tobacco. She reports that she does not drink alcohol or use drugs.  Family History  Problem Relation Age of Onset  . Heart disease Mother   . Heart attack Mother   . Cancer Brother   . Breast cancer Daughter   . Breast cancer Maternal Aunt   . Breast cancer Cousin     Medications: Patient's Medications   No medications on file     Physical Exam: none due to being televisit.  Labs reviewed: Basic Metabolic Panel: Recent Labs    08/28/17 1140 10/23/17 1205  NA 142 141  K 4.8 4.8  CL 107 106  CO2 29 28  GLUCOSE 102* 90  BUN 12 17  CREATININE 1.11* 1.05*  CALCIUM 9.4 9.5  TSH  --  3.89   Liver Function Tests: Recent Labs    08/28/17 1140 02/04/18 0929  AST 14  --   ALT 7 9  BILITOT 0.6  --   PROT 6.7  --    No results for input(s): LIPASE,  AMYLASE in the last 8760 hours. No results for input(s): AMMONIA in the last 8760 hours. CBC: Recent Labs    10/23/17 1205  WBC 4.4  NEUTROABS 1,870  HGB 11.6*  HCT 34.9*  MCV 91.4  PLT 206   Lipid Panel: Recent Labs    08/28/17 1140 01/01/18 1122 02/04/18 0929  CHOL 230* 261* 170  HDL 70 74 72  LDLCALC 142* 171* 85  TRIG 74 67 46  CHOLHDL 3.3 3.5 2.4   TSH: Recent Labs    10/23/17 1205  TSH 3.89   A1C: Lab Results  Component Value Date   HGBA1C 6.0 (H) 01/01/2018     Assessment/Plan 1. Prediabetes -diet controlled - Hemoglobin A1c; Future  2. Mixed hyperlipidemia -off all medication, uses herbs to reduce cholesterol with dietary modifications.  - COMPLETE METABOLIC PANEL WITH GFR; Future - Lipid panel; Future  3. Essential hypertension -instructed to take blood pressure at home to monitor goal <140/90. Continues dash diet.  - CBC with Differential/Platelet; Future  4. Chronic pain of both shoulders Stable with aspercream and exercises.   5. Estrogen deficiency - DG Bone Density; Future  Next appt: 10/31/2018 Janene Harvey. Biagio Yang  [facility] [phone]     Follow Up Instructions:    I discussed the assessment and treatment plan with the patient. The patient was provided an opportunity to ask questions and all were answered. The patient agreed with the plan and demonstrated an understanding of the instructions.   The patient was advised to call back or seek an in-person evaluation if the symptoms worsen or if the condition fails to improve as anticipated.  I provided 16  minutes of non-face-to-face time during this encounter.   Edison Simon [place], [place]

## 2018-08-31 ENCOUNTER — Encounter: Payer: Self-pay | Admitting: Nurse Practitioner

## 2018-10-31 ENCOUNTER — Other Ambulatory Visit: Payer: Medicare Other

## 2018-11-04 ENCOUNTER — Other Ambulatory Visit: Payer: Self-pay

## 2018-11-04 ENCOUNTER — Ambulatory Visit (INDEPENDENT_AMBULATORY_CARE_PROVIDER_SITE_OTHER): Payer: Medicare Other | Admitting: Nurse Practitioner

## 2018-11-04 ENCOUNTER — Encounter: Payer: Self-pay | Admitting: Nurse Practitioner

## 2018-11-04 DIAGNOSIS — G8929 Other chronic pain: Secondary | ICD-10-CM

## 2018-11-04 DIAGNOSIS — M25511 Pain in right shoulder: Secondary | ICD-10-CM | POA: Diagnosis not present

## 2018-11-04 DIAGNOSIS — E782 Mixed hyperlipidemia: Secondary | ICD-10-CM

## 2018-11-04 DIAGNOSIS — R001 Bradycardia, unspecified: Secondary | ICD-10-CM | POA: Diagnosis not present

## 2018-11-04 DIAGNOSIS — I10 Essential (primary) hypertension: Secondary | ICD-10-CM

## 2018-11-04 DIAGNOSIS — R7303 Prediabetes: Secondary | ICD-10-CM

## 2018-11-04 DIAGNOSIS — R600 Localized edema: Secondary | ICD-10-CM

## 2018-11-04 DIAGNOSIS — M25512 Pain in left shoulder: Secondary | ICD-10-CM

## 2018-11-04 NOTE — Patient Instructions (Signed)
To use compression hose during the day to keep swelling down.  To elevate legs above the level of the heart to help (best to lay down with legs elevated for 30- 45 mins during the day)   Peripheral Edema  Peripheral edema is swelling that is caused by a buildup of fluid. Peripheral edema most often affects the lower legs, ankles, and feet. It can also develop in the arms, hands, and face. The area of the body that has peripheral edema will look swollen. It may also feel heavy or warm. Your clothes may start to feel tight. Pressing on the area may make a temporary dent in your skin. You may not be able to move your swollen arm or leg as much as usual. There are many causes of peripheral edema. It can happen because of a complication of other conditions such as congestive heart failure, kidney disease, or a problem with your blood circulation. It also can be a side effect of certain medicines or because of an infection. It often happens to women during pregnancy. Sometimes, the cause is not known. Follow these instructions at home: Managing pain, stiffness, and swelling   Raise (elevate) your legs while you are sitting or lying down.  Move around often to prevent stiffness and to lessen swelling.  Do not sit or stand for long periods of time.  Wear support stockings as told by your health care provider. Medicines  Take over-the-counter and prescription medicines only as told by your health care provider.  Your health care provider may prescribe medicine to help your body get rid of excess water (diuretic). General instructions  Pay attention to any changes in your symptoms.  Follow instructions from your health care provider about limiting salt (sodium) in your diet. Sometimes, eating less salt may reduce swelling.  Moisturize skin daily to help prevent skin from cracking and draining.  Keep all follow-up visits as told by your health care provider. This is important. Contact a health  care provider if you have:  A fever.  Edema that starts suddenly or is getting worse, especially if you are pregnant or have a medical condition.  Swelling in only one leg.  Increased swelling, redness, or pain in one or both of your legs.  Drainage or sores at the area where you have edema. Get help right away if you:  Develop shortness of breath, especially when you are lying down.  Have pain in your chest or abdomen.  Feel weak.  Feel faint. Summary  Peripheral edema is swelling that is caused by a buildup of fluid. Peripheral edema most often affects the lower legs, ankles, and feet.  Move around often to prevent stiffness and to lessen swelling. Do not sit or stand for long periods of time.  Pay attention to any changes in your symptoms.  Contact a health care provider if you have edema that starts suddenly or is getting worse, especially if you are pregnant or have a medical condition.  Get help right away if you develop shortness of breath, especially when lying down. This information is not intended to replace advice given to you by your health care provider. Make sure you discuss any questions you have with your health care provider. Document Released: 05/31/2004 Document Revised: 01/15/2018 Document Reviewed: 01/15/2018 Elsevier Patient Education  2020 [employer].

## 2018-11-04 NOTE — Progress Notes (Signed)
This service is provided via telemedicine  No vital signs collected/recorded due to the encounter was a telemedicine visit.   Location of patient (ex: home, work): Home   Patient consents to a telephone visit: Yes  Location of the provider (ex: office, home): [facility], Office   Name of any referring provider:  N/A  Names of all persons participating in the telemedicine service and their role in the encounter:  S.Chrae B/CMA, Abbey ChattersJessica Oliwia Berzins, NP, and Patient   Time spent on call:  4 min with medical assistant       Careteam: Patient Care Team: Sharon SellerEubanks, Norleen Xie K, NP as PCP - General (Geriatric Medicine) Regan Lemmingamnitz, Will Martin, MD as Consulting Physician (Cardiology) Chalmers GuestWhitaker, Roy, MD as Consulting Physician (Ophthalmology)  Advanced Directive information Does Patient Have a Medical Advance Directive?: Yes, Type of Advance Directive: Healthcare Power of PrivateerAttorney;Living will  No Known Allergies  Chief Complaint  Patient presents with  . Medical Management of Chronic Issues    3 month follow-up. Telephone Visit   . Immunizations    Shingrix, discuss      HPI: Patient is a 85 y.o. female for routine follow up  Continues to not take any medication, uses herbs and lifestyle modifications.    HTN - previously on amlodipine now not currently taking medications. Using herbs to help this. Attempts low sodium diet. insurance nurse took blood pressure when she came by recently-  138/78  Bradycardia - Mobitz I/bifascicular block. HR 68;  No palpitations. Followed by cardio Dr Corey Skainsamnitzannually (was due Apr 2020 but did not go due to COVID) no chest pains, shortness of breath  Prediabetes - stable. A1c6.0% diet controlled. nurse educated to check feet.   Has some lower extremity edema- worse in the evening better in morning. Sits a lot throughout the day. Legs not painful. No shortness of breath.   Hyperlipidemia- diet controlled and uses a tea. Not on  medication.   GERD- hx of HH, no current acid reflux.   Chronic left shoulder pain- ongoing, uses tylenol if needed for this which controls pain.    Review of Systems:  Review of Systems  Constitutional: Negative for chills, diaphoresis, fever, malaise/fatigue and weight loss.  Eyes: Negative for blurred vision.  Respiratory: Negative for cough and shortness of breath.   Cardiovascular: Positive for leg swelling (in the evening). Negative for chest pain and palpitations.  Gastrointestinal: Negative for constipation, diarrhea and heartburn.  Genitourinary: Negative for dysuria and urgency.  Musculoskeletal: Positive for myalgias. Negative for falls and joint pain.       Pain to left shoulder blade area occasionally   Neurological: Negative for dizziness and headaches.  Psychiatric/Behavioral: Negative for depression and memory loss. The patient is not nervous/anxious and does not have insomnia.     Past Medical History:  Diagnosis Date  . Bifascicular block 12/03/2016  . Diabetes mellitus without complication (HCC)   . Hypertension   . Mobitz I 12/03/2016   History reviewed. No pertinent surgical history. Social History:   reports that she has never smoked. She has never used smokeless tobacco. She reports that she does not drink alcohol or use drugs.  Family History  Problem Relation Age of Onset  . Heart disease Mother   . Heart attack Mother   . Cancer Brother   . Breast cancer Daughter   . Breast cancer Maternal Aunt   . Breast cancer Cousin     Medications: Patient's Medications  New Prescriptions  No medications on file  Previous Medications   ACETAMINOPHEN (TYLENOL 8 HOUR ARTHRITIS PAIN) 650 MG CR TABLET    Take 650 mg by mouth every 8 (eight) hours as needed for pain.  Modified Medications   No medications on file  Discontinued Medications   No medications on file    Physical Exam:  There were no vitals filed for this visit. There is no height or  weight on file to calculate BMI. Wt Readings from Last 3 Encounters:  05/05/18 169 lb (76.7 kg)  01/01/18 164 lb (74.4 kg)  01/01/18 164 lb (74.4 kg)    Labs reviewed: Basic Metabolic Panel: No results for input(s): NA, K, CL, CO2, GLUCOSE, BUN, CREATININE, CALCIUM, MG, PHOS, TSH in the last 8760 hours. Liver Function Tests: Recent Labs    02/04/18 0929  ALT 9   No results for input(s): LIPASE, AMYLASE in the last 8760 hours. No results for input(s): AMMONIA in the last 8760 hours. CBC: No results for input(s): WBC, NEUTROABS, HGB, HCT, MCV, PLT in the last 8760 hours. Lipid Panel: Recent Labs    01/01/18 1122 02/04/18 0929  CHOL 261* 170  HDL 74 72  LDLCALC 171* 85  TRIG 67 46  CHOLHDL 3.5 2.4   TSH: No results for input(s): TSH in the last 8760 hours. A1C: Lab Results  Component Value Date   HGBA1C 6.0 (H) 01/01/2018     Assessment/Plan 1. Mixed hyperlipidemia -diet controlled. Will follow up lipids  2. Bradycardia -hx of Mobitz I/bifascicular block, HR at 68 on nurse check, without symptoms at this time. Cancelled appt with cardiologist due to [place].  3. Prediabetes -diet controlled, will follow up a1c  4. Essential hypertension -stable on recent blood pressure check by nurse. To continue lifestyle modifications  5. Chronic pain of both shoulders Ongoing but controlled on tylenol and exercises.   6. Bilateral lower extremity edema -appears to be related to venous insuffiencey, encouraged to use compression hose during the day. Decrease salt. Increase protein intake and elevate legs above the level of the heart.   Next appt: 11/17/2018 for labs and 3 month follow up [place]. Harle Battiest  [facility] [phone]    Virtual Visit via Telephone Note  I connected with@ on 11/04/18 at  1:00 PM EDT by telephone and verified that I am speaking with the correct person using two identifiers.  Location: Patient: home  Provider: office   I discussed the limitations, risks, security and privacy concerns of performing an evaluation and management service by telephone and the availability of in person appointments. I also discussed with the patient that there may be a patient responsible charge related to this service. The patient expressed understanding and agreed to proceed.   I discussed the assessment and treatment plan with the patient. The patient was provided an opportunity to ask questions and all were answered. The patient agreed with the plan and demonstrated an understanding of the instructions.   The patient was advised to call back or seek an in-person evaluation if the symptoms worsen or if the condition fails to improve as anticipated.  I provided 16 minutes of non-face-to-face time during this encounter.  Carlos American. Harle Battiest Avs printed and mailed

## 2018-11-10 DIAGNOSIS — H401113 Primary open-angle glaucoma, right eye, severe stage: Secondary | ICD-10-CM | POA: Diagnosis not present

## 2018-11-10 DIAGNOSIS — H04123 Dry eye syndrome of bilateral lacrimal glands: Secondary | ICD-10-CM | POA: Diagnosis not present

## 2018-11-12 ENCOUNTER — Other Ambulatory Visit: Payer: Medicare Other

## 2018-11-14 ENCOUNTER — Other Ambulatory Visit: Payer: Medicare Other

## 2018-11-14 ENCOUNTER — Other Ambulatory Visit: Payer: Self-pay

## 2018-11-14 DIAGNOSIS — I10 Essential (primary) hypertension: Secondary | ICD-10-CM

## 2018-11-14 DIAGNOSIS — E782 Mixed hyperlipidemia: Secondary | ICD-10-CM

## 2018-11-14 DIAGNOSIS — R7303 Prediabetes: Secondary | ICD-10-CM | POA: Diagnosis not present

## 2018-11-15 LAB — COMPLETE METABOLIC PANEL WITH GFR
AG Ratio: 1.5 (calc) (ref 1.0–2.5)
ALT: 6 U/L (ref 6–29)
AST: 15 U/L (ref 10–35)
Albumin: 4 g/dL (ref 3.6–5.1)
Alkaline phosphatase (APISO): 83 U/L (ref 37–153)
BUN/Creatinine Ratio: 13 (calc) (ref 6–22)
BUN: 14 mg/dL (ref 7–25)
CO2: 28 mmol/L (ref 20–32)
Calcium: 9.8 mg/dL (ref 8.6–10.4)
Chloride: 107 mmol/L (ref 98–110)
Creat: 1.06 mg/dL — ABNORMAL HIGH (ref 0.60–0.88)
GFR, Est African American: 53 mL/min/{1.73_m2} — ABNORMAL LOW (ref >=60)
GFR, Est Non African American: 46 mL/min/{1.73_m2} — ABNORMAL LOW (ref >=60)
Globulin: 2.7 g/dL (calc) (ref 1.9–3.7)
Glucose, Bld: 106 mg/dL — ABNORMAL HIGH (ref 65–99)
Potassium: 5 mmol/L (ref 3.5–5.3)
Sodium: 143 mmol/L (ref 135–146)
Total Bilirubin: 0.6 mg/dL (ref 0.2–1.2)
Total Protein: 6.7 g/dL (ref 6.1–8.1)

## 2018-11-15 LAB — LIPID PANEL
Cholesterol: 270 mg/dL — ABNORMAL HIGH (ref <200)
HDL: 76 mg/dL (ref >=50)
LDL Cholesterol (Calc): 175 mg/dL (calc) — ABNORMAL HIGH
Non-HDL Cholesterol (Calc): 194 mg/dL (calc) — ABNORMAL HIGH (ref <130)
Total CHOL/HDL Ratio: 3.6 (calc) (ref <5.0)
Triglycerides: 80 mg/dL (ref <150)

## 2018-11-15 LAB — CBC WITH DIFFERENTIAL/PLATELET
Absolute Monocytes: 407 cells/uL (ref 200–950)
Basophils Absolute: 8 cells/uL (ref 0–200)
Basophils Relative: 0.2 %
Eosinophils Absolute: 59 cells/uL (ref 15–500)
Eosinophils Relative: 1.4 %
HCT: 36.9 % (ref 35.0–45.0)
Hemoglobin: 12 g/dL (ref 11.7–15.5)
Lymphs Abs: 2016 cells/uL (ref 850–3900)
MCH: 30.2 pg (ref 27.0–33.0)
MCHC: 32.5 g/dL (ref 32.0–36.0)
MCV: 92.7 fL (ref 80.0–100.0)
MPV: 11 fL (ref 7.5–12.5)
Monocytes Relative: 9.7 %
Neutro Abs: 1709 cells/uL (ref 1500–7800)
Neutrophils Relative %: 40.7 %
Platelets: 194 10*3/uL (ref 140–400)
RBC: 3.98 10*6/uL (ref 3.80–5.10)
RDW: 13 % (ref 11.0–15.0)
Total Lymphocyte: 48 %
WBC: 4.2 10*3/uL (ref 3.8–10.8)

## 2018-11-15 LAB — HEMOGLOBIN A1C
Hgb A1c MFr Bld: 6.1 % of total Hgb — ABNORMAL HIGH (ref <5.7)
Mean Plasma Glucose: 128 (calc)
eAG (mmol/L): 7.1 (calc)

## 2018-11-17 ENCOUNTER — Other Ambulatory Visit: Payer: Medicare Other

## 2018-12-11 DIAGNOSIS — H401113 Primary open-angle glaucoma, right eye, severe stage: Secondary | ICD-10-CM | POA: Diagnosis not present

## 2019-01-01 ENCOUNTER — Emergency Department (HOSPITAL_COMMUNITY)
Admission: EM | Admit: 2019-01-01 | Discharge: 2019-01-01 | Disposition: A | Discharge status: Home / Self Care | Payer: Medicare Other | Attending: Emergency Medicine | Admitting: Emergency Medicine

## 2019-01-01 ENCOUNTER — Other Ambulatory Visit: Payer: Self-pay

## 2019-01-01 ENCOUNTER — Encounter (HOSPITAL_COMMUNITY): Payer: Self-pay | Admitting: Emergency Medicine

## 2019-01-01 DIAGNOSIS — Z79899 Other long term (current) drug therapy: Secondary | ICD-10-CM | POA: Diagnosis not present

## 2019-01-01 DIAGNOSIS — T63461A Toxic effect of venom of wasps, accidental (unintentional), initial encounter: Secondary | ICD-10-CM | POA: Diagnosis not present

## 2019-01-01 DIAGNOSIS — E119 Type 2 diabetes mellitus without complications: Secondary | ICD-10-CM | POA: Diagnosis not present

## 2019-01-01 DIAGNOSIS — S0086XA Insect bite (nonvenomous) of other part of head, initial encounter: Secondary | ICD-10-CM | POA: Diagnosis not present

## 2019-01-01 DIAGNOSIS — I10 Essential (primary) hypertension: Secondary | ICD-10-CM | POA: Insufficient documentation

## 2019-01-01 NOTE — ED Triage Notes (Addendum)
Per pt, states she was stung by a wasp this morning-states right forehead is sore-no symptoms of anaphylaxis

## 2019-01-01 NOTE — ED Provider Notes (Signed)
[facility] DEPT Provider Note   CSN: [id number] Arrival date & time: 01/01/19  1324     History   Chief Complaint Chief Complaint  Patient presents with  . Insect Bite    HPI Wendy Yang is a 85 y.o. female with a hx of mobitz 1, DM, HTN, & hypercholesterolemia who presents to the ED for evaluation of wasp sting which occurred @ 09:30 this AM. Patient states there is a wasp nest in her backyard and that 30 minutes PTA she was stung in the R forehead area leading to a raised swollen area in this location. States pain is 2/10 in severity to area w/o alleviating/aggravating factors. She denies intra-oral/lip swelling, dyspnea, sensation of throat closing, wheezing, N/V, rash, or syncope.      HPI  Past Medical History:  Diagnosis Date  . Bifascicular block 12/03/2016  . Diabetes mellitus without complication ([facility])   . Hypertension   . Mobitz I 12/03/2016    Patient Active Problem List   Diagnosis Date Noted  . Mixed hyperlipidemia 08/28/2017  . High risk medication use 08/28/2017  . Mobitz I 12/03/2016  . Bifascicular block 12/03/2016  . Bradycardia 11/16/2016  . Seasonal allergic rhinitis due to pollen 11/16/2016  . Prediabetes 11/16/2016  . Abnormal ECG 11/16/2016  . Deformity of toe of left foot 11/16/2016  . Breast tenderness in female 11/16/2016  . Right-sided face pain 10/19/2014  . Viral URI with cough 05/11/2014  . Bilateral lower extremity edema 02/09/2014  . Hymenoptera allergy 10/02/2013  . Bilateral hand pain 06/16/2013  . Cerumen impaction 06/16/2013  . Elevated TSH 03/16/2013  . Dizziness and giddiness 03/06/2013  . Pain in right shoulder 03/06/2013  . Vision changes 03/06/2013  . GERD (gastroesophageal reflux disease) 09/17/2012  . Facial droop 05/20/2012  . Radicular pain in right arm 05/05/2012  . Lip swelling 05/05/2012  . Exposure to hepatitis A 05/05/2012  . Nasal congestion with rhinorrhea 12/28/2011  .  Hypercholesterolemia 09/26/2011  . Hypertension 08/21/2011    History reviewed. No pertinent surgical history.   OB History   No obstetric history on file.      Home Medications    Prior to Admission medications   Medication Sig Start Date End Date Taking? Authorizing Provider  acetaminophen (TYLENOL 8 HOUR ARTHRITIS PAIN) 650 MG CR tablet Take 650 mg by mouth every 8 (eight) hours as needed for pain.    [provider]    Family History Family History  Problem Relation Age of Onset  . Heart disease Mother   . Heart attack Mother   . Cancer Brother   . Breast cancer Daughter   . Breast cancer Maternal Aunt   . Breast cancer Cousin     Social History Social History   Tobacco Use  . Smoking status: Never Smoker  . Smokeless tobacco: Never Used  Substance Use Topics  . Alcohol use: No    Alcohol/week: 0.0 standard drinks  . Drug use: No     Allergies   Patient has no known allergies.   Review of Systems Review of Systems  Constitutional: Negative for chills and fever.  HENT: Negative for congestion, sore throat, trouble swallowing and voice change.   Respiratory: Negative for cough, shortness of breath and wheezing.   Gastrointestinal: Negative for nausea and vomiting.  Skin: Positive for wound. Negative for rash.  Neurological: Negative for syncope.  All other systems reviewed and are negative.    Physical Exam Updated Vital  Signs BP (!) 177/81 (BP Location: Left Arm)   Pulse 66   Temp 98.8 F (37.1 C) (Oral)   Resp 16   SpO2 99%   Physical Exam Vitals signs and nursing note reviewed.  Constitutional:      General: She is not in acute distress.    Appearance: She is well-developed.  HENT:     Head: Normocephalic and atraumatic.      Nose: Nose normal.     Mouth/Throat:     Mouth: Mucous membranes are moist.     Pharynx: Oropharynx is clear. No oropharyngeal exudate or posterior oropharyngeal erythema.     Comments: No angioedema  noted.  Posterior oropharynx is symmetric appearing. Patient tolerating own secretions without difficulty.  Airways patent.  No trismus. No drooling. No hot potato voice. No swelling beneath the tongue, submandibular compartment is soft.  Eyes:     General:        Right eye: No discharge.        Left eye: No discharge.     Conjunctiva/sclera: Conjunctivae normal.  Neck:     Musculoskeletal: Normal range of motion and neck supple. No neck rigidity.  Cardiovascular:     Rate and Rhythm: Normal rate and regular rhythm.  Pulmonary:     Effort: Pulmonary effort is normal. No respiratory distress.     Breath sounds: Normal breath sounds. No stridor. No wheezing, rhonchi or rales.  Chest:     Chest wall: No tenderness.  Skin:    General: Skin is warm and dry.     Comments: No urticaria  Neurological:     Mental Status: She is alert.     Comments: Clear speech.   Psychiatric:        Behavior: Behavior normal.        Thought Content: Thought content normal.     ED Treatments / Results  Labs (all labs ordered are listed, but only abnormal results are displayed) Labs Reviewed - No data to display  EKG None  Radiology No results found.  Procedures Procedures (including critical care time)  Medications Ordered in ED Medications - No data to display   Initial Impression / Assessment and Plan / ED Course  I have reviewed the triage vital signs and the nursing notes.  Pertinent labs & imaging results that were available during my care of the patient were reviewed by me and considered in my medical decision making (see chart for details).   Patient presents to the emergency department status post blast pain to her right forehead.  She is nontoxic-appearing, no apparent distress, vitals WNL with exception of elevated blood pressure, doubt HTN emergency, PCP recheck.  Patient appears to have fairly minimal localized reaction to the right forehead with 1 cm diameter area of erythema and  swelling.  No urticarial rash. No angioedema or respiratory distress. Airway is patent. Exam not consistent w/ anaphylaxis.  Discussed supportive care with cool compresses and Tylenol as needed for discomfort.  Discussed treatment plan, need for follow-up, and return precautions with the patient. Provided opportunity for questions, patient confirmed understanding and is in agreement with plan.      Findings and plan of care discussed with supervising physician Dr. Pilar Plate who has evaluated patient & is in agreement.   Final Clinical Impressions(s) / ED Diagnoses   Final diagnoses:  Wasp sting, accidental or unintentional, initial encounter    ED Discharge Orders    None       Ronal Maybury, Lelon Mast  R, PA-C 01/01/19 1008    Sabas SousBero, Michael M, MD 01/01/19 [id number]

## 2019-01-01 NOTE — Discharge Instructions (Addendum)
You were seen in the emergency department today after a wasp sting.  Please apply cool compresses as needed for pain/swelling.   You may take tylenol as needed for discomfort per over the counter dosing.   Follow-up with your primary care provider within 3 to 5 days for reevaluation of the area as well as a recheck of your blood pressure as it was elevated in the ER today.  Return to the ER for new or worsening symptoms including but not limited to lip swelling, throat swelling, feeling of throat closing, trouble breathing, nausea, vomiting, passing out, or any other concerns.  Vitals:   01/01/19 0945  BP: (!) 177/81  Pulse: 66  Resp: 16  Temp: 98.8 F (37.1 C)  SpO2: 99%

## 2019-01-05 ENCOUNTER — Encounter: Payer: Self-pay | Admitting: Family

## 2019-01-05 ENCOUNTER — Ambulatory Visit: Payer: Self-pay

## 2019-01-06 ENCOUNTER — Encounter: Payer: Self-pay | Admitting: Family

## 2019-01-06 ENCOUNTER — Ambulatory Visit (INDEPENDENT_AMBULATORY_CARE_PROVIDER_SITE_OTHER): Payer: Medicare Other | Admitting: Family

## 2019-01-06 ENCOUNTER — Other Ambulatory Visit: Payer: Self-pay

## 2019-01-06 VITALS — BP 138/82 | HR 62 | Temp 97.7°F | Ht 63.0 in | Wt 173.2 lb

## 2019-01-06 DIAGNOSIS — M25441 Effusion, right hand: Secondary | ICD-10-CM | POA: Diagnosis not present

## 2019-01-06 DIAGNOSIS — Z0000 Encounter for general adult medical examination without abnormal findings: Secondary | ICD-10-CM

## 2019-01-06 DIAGNOSIS — R600 Localized edema: Secondary | ICD-10-CM | POA: Diagnosis not present

## 2019-01-06 DIAGNOSIS — G8929 Other chronic pain: Secondary | ICD-10-CM | POA: Diagnosis not present

## 2019-01-06 DIAGNOSIS — M25511 Pain in right shoulder: Secondary | ICD-10-CM | POA: Diagnosis not present

## 2019-01-06 DIAGNOSIS — M25512 Pain in left shoulder: Secondary | ICD-10-CM

## 2019-01-06 DIAGNOSIS — M25561 Pain in right knee: Secondary | ICD-10-CM | POA: Diagnosis not present
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
## 2019-01-06 NOTE — Patient Instructions (Signed)
Please get X-ray of right hand done at [facility] at [place]. Continue on Tylenol for pain.

## 2019-01-06 NOTE — Progress Notes (Signed)
Subjective:   Jennette BankerMargaret H Holness is a 85 y.o. female who presents for Medicare Annual (Subsequent) preventive examination.  Review of Systems:  Cardiac Risk Factors include: advanced age (>3755men, 60>65 women);obesity (BMI >30kg/m2)     Objective:     Vitals: BP 138/82   Pulse 62   Temp 97.7 F (36.5 C) (Oral)   Ht 5\' 3"  (1.6 m)   Wt 173 lb 3.2 oz (78.6 kg)   SpO2 98%   BMI 30.68 kg/m   Body mass index is 30.68 kg/m.  Advanced Directives 01/06/2019 01/06/2019 11/04/2018 05/05/2018 01/01/2018 10/22/2017 01/09/2017  Does Patient Have a Medical Advance Directive? Yes Yes Yes No Yes No No  Type of Advance Directive Living will Living will Healthcare Power of [place]orney;Living will - Living will - -  Does patient want to make changes to medical advance directive? No - Patient declined No - Patient declined - - No - Patient declined - -  Copy of Healthcare Power of Attorney in Chart? - - No - copy requested - - - -  Would patient like information on creating a medical advance directive? - - - No - Patient declined - - -    Tobacco Social History   Tobacco Use  Smoking Status Never Smoker  Smokeless Tobacco Never Used     Counseling given: Not Answered   Clinical Intake:  Pre-visit preparation completed: No  Pain : 0-10 Pain Score: 10-Worst pain ever Pain Type: Chronic pain Pain Location: Shoulder Pain Orientation: Right(both shoulders and right fingers) Pain Radiating Towards: down to the elbows and back Pain Descriptors / Indicators: Aching Pain Onset: Other (comment)(several months) Pain Frequency: Constant Pain Relieving Factors: Tylenol,aspercreme Effect of Pain on Daily Activities: lying down at night  Pain Relieving Factors: Tylenol,aspercreme  BMI - recorded: 30.68 Nutritional Status: BMI > 30  Obese Nutritional Risks: None  How often do you need to have someone help you when you read instructions, pamphlets, or other written materials from your doctor or pharmacy?:  1 - Never What is the last grade level you completed in school?: 8 grade  Interpreter Needed?: No  Information entered by :: Ladarrius Bogdanski FNP-C  Past Medical History:  Diagnosis Date  . Bifascicular block 12/03/2016  . Diabetes mellitus without complication (HCC)   . Hypertension   . Mobitz I 12/03/2016   History reviewed. No pertinent surgical history. Family History  Problem Relation Age of Onset  . Heart disease Mother   . Heart attack Mother   . Cancer Brother   . Breast cancer Daughter   . Breast cancer Maternal Aunt   . Breast cancer Cousin    Social History   Socioeconomic History  . Marital status: Divorced    Spouse name: Not on file  . Number of children: Not on file  . Years of education: Not on file  . Highest education level: Not on file  Occupational History  . Not on file  Social Needs  . Financial resource strain: Not hard at all  . Food insecurity    Worry: Never true    Inability: Never true  . Transportation needs    Medical: No    Non-medical: No  Tobacco Use  . Smoking status: Never Smoker  . Smokeless tobacco: Never Used  Substance and Sexual Activity  . Alcohol use: No    Alcohol/week: 0.0 standard drinks  . Drug use: No  . Sexual activity: Not Currently  Lifestyle  . Physical activity  Days per week: 0 days    Minutes per session: 0 min  . Stress: Only a little  Relationships  . Social connections    Talks on phone: More than three times a week    Gets together: More than three times a week    Attends religious service: More than 4 times per year    Active member of club or organization: No    Attends meetings of clubs or organizations: Never    Relationship status: Divorced  Other Topics Concern  . Not on file  Social History Narrative   Diet:    Do you drink/eat things with caffeine? Coffee   Marital status: Divorced                        What year were you married? 1946   Do you live in a house, apartment, assisted  living, condo, trailer, etc)? House   Is it one or more stories?  One   How many persons live in your home? 1   Do you have any pets in your home? No   Current or past profession: Food Packer   Do you exercise?  Yes                                                   Type & how often: Walk, yard work    Do you have a living will?    Do you have a DNR Form?   Do you have a POA/HPOA forms?          Outpatient Encounter Medications as of 01/06/2019  Medication Sig  . acetaminophen (TYLENOL 8 HOUR ARTHRITIS PAIN) 650 MG CR tablet Take 650 mg by mouth every 8 (eight) hours as needed for pain.  . cholecalciferol (VITAMIN D3) 25 MCG (1000 UT) tablet Take 1,000 Units by mouth daily.  . vitamin C (ASCORBIC ACID) 500 MG tablet Take 500 mg by mouth daily.   No facility-administered encounter medications on file as of 01/06/2019.     Activities of Daily Living In your present state of health, do you have any difficulty performing the following activities: 01/06/2019  Hearing? Y  Comment wears bilateral hearing aids  Vision? N  Comment wears corrective lens  Difficulty concentrating or making decisions? N  Walking or climbing stairs? Y  Comment has to be careful sometimes has pain in right knee  Dressing or bathing? Y  Comment shoulder pain  Doing errands, shopping? N  Preparing Food and eating ? N  Using the Toilet? N  In the past six months, have you accidently leaked urine? N  Do you have problems with loss of bowel control? N  Managing your Medications? N  Managing your Finances? N  Housekeeping or managing your Housekeeping? N  Some recent data might be hidden    Patient Care Team: Sharon Seller, NP as PCP - General (Geriatric Medicine) Regan Lemming, MD as Consulting Physician (Cardiology) Chalmers Guest, MD as Consulting Physician (Ophthalmology)    Assessment:   This is a routine wellness examination for [facility].  Exercise Activities and Dietary recommendations  Current Exercise Habits: The patient does not participate in regular exercise at present, Exercise limited by: None identified  Goals    . Maintain Lifestyle     Pt will maintain  lifestyle.        Fall Risk Fall Risk  01/06/2019 01/06/2019 08/04/2018 01/01/2018 10/22/2017  Falls in the past year? 0 0 0 No No  Number falls in past yr: 0 0 0 - -  Injury with Fall? 0 0 0 - -   Is the patient's home free of loose throw rugs in walkways, pet beds, electrical cords, etc?   no      Grab bars in the bathroom? no      Handrails on the stairs?   no stairs       Adequate lighting?   yes  Depression Screen PHQ 2/9 Scores 01/06/2019 08/04/2018 01/01/2018 11/16/2016  PHQ - 2 Score 0 0 0 0     Cognitive Function MMSE - Mini Mental State Exam 01/06/2019 01/01/2018 11/15/2016  Orientation to time 5 5 4   Orientation to Place 5 5 5   Registration 3 3 3   Attention/ Calculation 5 0 1  Recall 3 3 2   Language- name 2 objects 2 2 2   Language- repeat 1 1 1   Language- follow 3 step command 3 3 2   Language- read & follow direction 1 1 1   Write a sentence 1 1 1   Copy design 0 0 1  Total score 29 24 23         Immunization History  Administered Date(s) Administered  . Hepatitis A 05/05/2012  . Influenza,inj,Quad PF,6+ Mos 03/06/2013, 03/30/2015  . Pneumococcal Conjugate-13 05/05/2018    Qualifies for Shingles Vaccine? Has had shingles   Screening Tests Health Maintenance  Topic Date Due  . INFLUENZA VACCINE  05/08/2023 (Originally 12/06/2018)  . TETANUS/TDAP  11/19/2029 (Originally 06/28/1944)  . PNA vac Low Risk Adult (2 of 2 - PPSV23) 05/06/2019  . DEXA SCAN  Completed    Cancer Screenings: Lung: Low Dose CT Chest recommended if Age 8-80 years, 30 pack-year currently smoking OR have quit w/in 15years. Patient does not qualify. Breast:  Up to date on Mammogram? No aged out   Up to date of Bone Density/Dexa? Yes Colorectal: N/A aged out   Additional Screenings:  Hepatitis C Screening: low Risk       Plan:   I have personally reviewed and noted the following in the patient's chart:   . Medical and social history . Use of alcohol, tobacco or illicit drugs  . Current medications and supplements . Functional ability and status . Nutritional status . Physical activity . Advanced directives . List of other physicians . Hospitalizations, surgeries, and ER visits in previous 12 months . Vitals . Screenings to include cognitive, depression, and falls . Referrals and appointments  In addition, I have reviewed and discussed with patient certain preventive protocols, quality metrics, and best practice recommendations. A written personalized care plan for preventive services as well as general preventive health recommendations were provided to patient.   Sandrea Hughs, NP  01/06/2019

## 2019-01-06 NOTE — Patient Instructions (Signed)
Wendy Yang , Thank you for taking time to come for your Medicare Wellness Visit. I appreciate your ongoing commitment to your health goals. Please review the following plan we discussed and let me know if I can assist you in the future.   Screening recommendations/referrals: Colonoscopy: Aged out  Mammogram Aged out  Bone Density: Up to date  Recommended yearly ophthalmology/optometry visit for glaucoma screening and checkup Recommended yearly dental visit for hygiene and checkup  Vaccinations: Influenza vaccine: Due 2020  Pneumococcal vaccine: due 04/2019  Tdap vaccine : Up to date  Shingles vaccine: Had shingles    Advanced directives: yes   Conditions/risks identified: Advance age female > 56 yrs,Obesity   Next appointment: 1 year    Preventive Care 34 Years and Older, Female Preventive care refers to lifestyle choices and visits with your health care provider that can promote health and wellness. What does preventive care include?  A yearly physical exam. This is also called an annual well check.  Dental exams once or twice a year.  Routine eye exams. Ask your health care provider how often you should have your eyes checked.  Personal lifestyle choices, including:  Daily care of your teeth and gums.  Regular physical activity.  Eating a healthy diet.  Avoiding tobacco and drug use.  Limiting alcohol use.  Practicing safe sex.  Taking low-dose aspirin every day.  Taking vitamin and mineral supplements as recommended by your health care provider. What happens during an annual well check? The services and screenings done by your health care provider during your annual well check will depend on your age, overall health, lifestyle risk factors, and family history of disease. Counseling  Your health care provider may ask you questions about your:  Alcohol use.  Tobacco use.  Drug use.  Emotional well-being.  Home and relationship well-being.  Sexual  activity.  Eating habits.  History of falls.  Memory and ability to understand (cognition).  Work and work Statistician.  Reproductive health. Screening  You may have the following tests or measurements:  Height, weight, and BMI.  Blood pressure.  Lipid and cholesterol levels. These may be checked every 5 years, or more frequently if you are over 35 years old.  Skin check.  Lung cancer screening. You may have this screening every year starting at age 57 if you have a 30-pack-year history of smoking and currently smoke or have quit within the past 15 years.  Fecal occult blood test (FOBT) of the stool. You may have this test every year starting at age 25.  Flexible sigmoidoscopy or colonoscopy. You may have a sigmoidoscopy every 5 years or a colonoscopy every 10 years starting at age 75.  Hepatitis C blood test.  Hepatitis B blood test.  Sexually transmitted disease (STD) testing.  Diabetes screening. This is done by checking your blood sugar (glucose) after you have not eaten for a while (fasting). You may have this done every 1-3 years.  Bone density scan. This is done to screen for osteoporosis. You may have this done starting at age 31.  Mammogram. This may be done every 1-2 years. Talk to your health care provider about how often you should have regular mammograms. Talk with your health care provider about your test results, treatment options, and if necessary, the need for more tests. Vaccines  Your health care provider may recommend certain vaccines, such as:  Influenza vaccine. This is recommended every year.  Tetanus, diphtheria, and acellular pertussis (Tdap, Td) vaccine. You  may need a Td booster every 10 years.  Zoster vaccine. You may need this after age 88.  Pneumococcal 13-valent conjugate (PCV13) vaccine. One dose is recommended after age 67.  Pneumococcal polysaccharide (PPSV23) vaccine. One dose is recommended after age 47. Talk to your health care  provider about which screenings and vaccines you need and how often you need them. This information is not intended to replace advice given to you by your health care provider. Make sure you discuss any questions you have with your health care provider. Document Released: 05/20/2015 Document Revised: 01/11/2016 Document Reviewed: 02/22/2015 Elsevier Interactive Patient Education  2017 [employer] Prevention in the Home Falls can cause injuries. They can happen to people of all ages. There are many things you can do to make your home safe and to help prevent falls. What can I do on the outside of my home?  Regularly fix the edges of walkways and driveways and fix any cracks.  Remove anything that might make you trip as you walk through a door, such as a raised step or threshold.  Trim any bushes or trees on the path to your home.  Use bright outdoor lighting.  Clear any walking paths of anything that might make someone trip, such as rocks or tools.  Regularly check to see if handrails are loose or broken. Make sure that both sides of any steps have handrails.  Any raised decks and porches should have guardrails on the edges.  Have any leaves, snow, or ice cleared regularly.  Use sand or salt on walking paths during winter.  Clean up any spills in your garage right away. This includes oil or grease spills. What can I do in the bathroom?  Use night lights.  Install grab bars by the toilet and in the tub and shower. Do not use towel bars as grab bars.  Use non-skid mats or decals in the tub or shower.  If you need to sit down in the shower, use a plastic, non-slip stool.  Keep the floor dry. Clean up any water that spills on the floor as soon as it happens.  Remove soap buildup in the tub or shower regularly.  Attach bath mats securely with double-sided non-slip rug tape.  Do not have throw rugs and other things on the floor that can make you trip. What can I do in  the bedroom?  Use night lights.  Make sure that you have a light by your bed that is easy to reach.  Do not use any sheets or blankets that are too big for your bed. They should not hang down onto the floor.  Have a firm chair that has side arms. You can use this for support while you get dressed.  Do not have throw rugs and other things on the floor that can make you trip. What can I do in the kitchen?  Clean up any spills right away.  Avoid walking on wet floors.  Keep items that you use a lot in easy-to-reach places.  If you need to reach something above you, use a strong step stool that has a grab bar.  Keep electrical cords out of the way.  Do not use floor polish or wax that makes floors slippery. If you must use wax, use non-skid floor wax.  Do not have throw rugs and other things on the floor that can make you trip. What can I do with my stairs?  Do not leave any items  on the stairs.  Make sure that there are handrails on both sides of the stairs and use them. Fix handrails that are broken or loose. Make sure that handrails are as long as the stairways.  Check any carpeting to make sure that it is firmly attached to the stairs. Fix any carpet that is loose or worn.  Avoid having throw rugs at the top or bottom of the stairs. If you do have throw rugs, attach them to the floor with carpet tape.  Make sure that you have a light switch at the top of the stairs and the bottom of the stairs. If you do not have them, ask someone to add them for you. What else can I do to help prevent falls?  Wear shoes that:  Do not have high heels.  Have rubber bottoms.  Are comfortable and fit you well.  Are closed at the toe. Do not wear sandals.  If you use a stepladder:  Make sure that it is fully opened. Do not climb a closed stepladder.  Make sure that both sides of the stepladder are locked into place.  Ask someone to hold it for you, if possible.  Clearly mark and  make sure that you can see:  Any grab bars or handrails.  First and last steps.  Where the edge of each step is.  Use tools that help you move around (mobility aids) if they are needed. These include:  Canes.  Walkers.  Scooters.  Crutches.  Turn on the lights when you go into a dark area. Replace any light bulbs as soon as they burn out.  Set up your furniture so you have a clear path. Avoid moving your furniture around.  If any of your floors are uneven, fix them.  If there are any pets around you, be aware of where they are.  Review your medicines with your doctor. Some medicines can make you feel dizzy. This can increase your chance of falling. Ask your doctor what other things that you can do to help prevent falls. This information is not intended to replace advice given to you by your health care provider. Make sure you discuss any questions you have with your health care provider. Document Released: 02/17/2009 Document Revised: 09/29/2015 Document Reviewed: 05/28/2014 Elsevier Interactive Patient Education  2017 [employer].

## 2019-01-07 LAB — CBC WITH DIFFERENTIAL/PLATELET
Absolute Monocytes: 352 cells/uL (ref 200–950)
Basophils Absolute: 12 cells/uL (ref 0–200)
Basophils Relative: 0.3 %
Eosinophils Absolute: 32 cells/uL (ref 15–500)
Eosinophils Relative: 0.8 %
HCT: 35 % (ref 35.0–45.0)
Hemoglobin: 11.4 g/dL — ABNORMAL LOW (ref 11.7–15.5)
Lymphs Abs: 1612 cells/uL (ref 850–3900)
MCH: 30.1 pg (ref 27.0–33.0)
MCHC: 32.6 g/dL (ref 32.0–36.0)
MCV: 92.3 fL (ref 80.0–100.0)
MPV: 11.2 fL (ref 7.5–12.5)
Monocytes Relative: 8.8 %
Neutro Abs: 1992 cells/uL (ref 1500–7800)
Neutrophils Relative %: 49.8 %
Platelets: 194 10*3/uL (ref 140–400)
RBC: 3.79 10*6/uL — ABNORMAL LOW (ref 3.80–5.10)
RDW: 13 % (ref 11.0–15.0)
Total Lymphocyte: 40.3 %
WBC: 4 10*3/uL (ref 3.8–10.8)

## 2019-01-07 LAB — URIC ACID: Uric Acid, Serum: 5.1 mg/dL (ref 2.5–7.0)

## 2019-01-07 LAB — RHEUMATOID FACTOR: Rheumatoid fact SerPl-aCnc: 15 IU/mL — ABNORMAL HIGH (ref <14)

## 2019-01-07 LAB — SEDIMENTATION RATE: Sed Rate: 9 mm/h (ref 0–30)

## 2019-01-22 DIAGNOSIS — H401113 Primary open-angle glaucoma, right eye, severe stage: Secondary | ICD-10-CM | POA: Diagnosis not present

## 2019-02-04 ENCOUNTER — Ambulatory Visit: Payer: Medicare Other | Admitting: Nurse Practitioner

## 2019-02-13 ENCOUNTER — Other Ambulatory Visit: Payer: Self-pay | Admitting: Nurse Practitioner

## 2019-02-13 DIAGNOSIS — E782 Mixed hyperlipidemia: Secondary | ICD-10-CM

## 2019-02-13 DIAGNOSIS — E7800 Pure hypercholesterolemia, unspecified: Secondary | ICD-10-CM

## 2019-02-13 DIAGNOSIS — R7303 Prediabetes: Secondary | ICD-10-CM

## 2019-02-13 DIAGNOSIS — I10 Essential (primary) hypertension: Secondary | ICD-10-CM

## 2019-02-16 ENCOUNTER — Other Ambulatory Visit: Payer: Self-pay

## 2019-04-01 ENCOUNTER — Other Ambulatory Visit: Payer: Self-pay

## 2019-04-01 ENCOUNTER — Emergency Department (HOSPITAL_COMMUNITY)
Admission: EM | Admit: 2019-04-01 | Discharge: 2019-04-02 | Disposition: A | Discharge status: Other Acute Inpatient Hospital | Payer: Medicare Other | Attending: Emergency Medicine | Admitting: Emergency Medicine

## 2019-04-01 ENCOUNTER — Emergency Department (HOSPITAL_COMMUNITY): Payer: Medicare Other

## 2019-04-01 DIAGNOSIS — I10 Essential (primary) hypertension: Secondary | ICD-10-CM | POA: Insufficient documentation

## 2019-04-01 DIAGNOSIS — I639 Cerebral infarction, unspecified: Secondary | ICD-10-CM

## 2019-04-01 DIAGNOSIS — E119 Type 2 diabetes mellitus without complications: Secondary | ICD-10-CM | POA: Insufficient documentation

## 2019-04-01 DIAGNOSIS — R29810 Facial weakness: Secondary | ICD-10-CM | POA: Diagnosis present

## 2019-04-01 DIAGNOSIS — U071 COVID-19: Secondary | ICD-10-CM | POA: Diagnosis not present

## 2019-04-01 LAB — CBC WITH DIFFERENTIAL/PLATELET
Abs Immature Granulocytes: 0.01 10*3/uL (ref 0.00–0.07)
Basophils Absolute: 0 10*3/uL (ref 0.0–0.1)
Basophils Relative: 0 %
Eosinophils Absolute: 0.1 10*3/uL (ref 0.0–0.5)
Eosinophils Relative: 1 %
HCT: 36.7 % (ref 36.0–46.0)
Hemoglobin: 11.7 g/dL — ABNORMAL LOW (ref 12.0–15.0)
Immature Granulocytes: 0 %
Lymphocytes Relative: 40 %
Lymphs Abs: 1.7 10*3/uL (ref 0.7–4.0)
MCH: 30.2 pg (ref 26.0–34.0)
MCHC: 31.9 g/dL (ref 30.0–36.0)
MCV: 94.8 fL (ref 80.0–100.0)
Monocytes Absolute: 0.4 10*3/uL (ref 0.1–1.0)
Monocytes Relative: 10 %
Neutro Abs: 2 10*3/uL (ref 1.7–7.7)
Neutrophils Relative %: 49 %
Platelets: 164 10*3/uL (ref 150–400)
RBC: 3.87 MIL/uL (ref 3.87–5.11)
RDW: 13.4 % (ref 11.5–15.5)
WBC: 4.1 10*3/uL (ref 4.0–10.5)
nRBC: 0 % (ref 0.0–0.2)

## 2019-04-01 LAB — I-STAT CHEM 8, ED
BUN: 23 mg/dL (ref 8–23)
Calcium, Ion: 1.11 mmol/L — ABNORMAL LOW (ref 1.15–1.40)
Chloride: 107 mmol/L (ref 98–111)
Creatinine, Ser: 1 mg/dL (ref 0.44–1.00)
Glucose, Bld: 144 mg/dL — ABNORMAL HIGH (ref 70–99)
HCT: 37 % (ref 36.0–46.0)
Hemoglobin: 12.6 g/dL (ref 12.0–15.0)
Potassium: 3.6 mmol/L (ref 3.5–5.1)
Sodium: 142 mmol/L (ref 135–145)
TCO2: 24 mmol/L (ref 22–32)

## 2019-04-01 LAB — COMPREHENSIVE METABOLIC PANEL
ALT: 12 U/L (ref 0–44)
AST: 28 U/L (ref 15–41)
Albumin: 3.8 g/dL (ref 3.5–5.0)
Alkaline Phosphatase: 77 U/L (ref 38–126)
Anion gap: 9 (ref 5–15)
BUN: 21 mg/dL (ref 8–23)
CO2: 21 mmol/L — ABNORMAL LOW (ref 22–32)
Calcium: 8.9 mg/dL (ref 8.9–10.3)
Chloride: 110 mmol/L (ref 98–111)
Creatinine, Ser: 1.21 mg/dL — ABNORMAL HIGH (ref 0.44–1.00)
GFR calc Af Amer: 45 mL/min — ABNORMAL LOW (ref >60)
GFR calc non Af Amer: 39 mL/min — ABNORMAL LOW (ref >60)
Glucose, Bld: 147 mg/dL — ABNORMAL HIGH (ref 70–99)
Potassium: 3.7 mmol/L (ref 3.5–5.1)
Sodium: 140 mmol/L (ref 135–145)
Total Bilirubin: 0.8 mg/dL (ref 0.3–1.2)
Total Protein: 6.7 g/dL (ref 6.5–8.1)

## 2019-04-01 LAB — PROTIME-INR
INR: 1 (ref 0.8–1.2)
Prothrombin Time: 13.4 seconds (ref 11.4–15.2)

## 2019-04-01 LAB — APTT: aPTT: 22 seconds — ABNORMAL LOW (ref 24–36)

## 2019-04-01 LAB — CBG MONITORING, ED: Glucose-Capillary: 135 mg/dL — ABNORMAL HIGH (ref 70–99)

## 2019-04-01 LAB — SARS CORONAVIRUS 2 BY RT PCR (HOSPITAL ORDER, PERFORMED IN ~~LOC~~ HOSPITAL LAB): SARS Coronavirus 2: POSITIVE — AB

## 2019-04-01 MED ORDER — IOHEXOL 350 MG/ML SOLN
40.0000 mL | Freq: Once | INTRAVENOUS | Status: AC | PRN
  Administered 2019-04-01: 40 mL via INTRAVENOUS

## 2019-04-01 MED ORDER — IOHEXOL 350 MG/ML SOLN
60.0000 mL | Freq: Once | INTRAVENOUS | Status: AC | PRN
  Administered 2019-04-01: 60 mL via INTRAVENOUS

## 2019-04-01 MED ORDER — SODIUM CHLORIDE 0.9 % IV SOLN
Freq: Once | INTRAVENOUS | Status: AC
  Administered 2019-04-01: 17:00:00 via INTRAVENOUS

## 2019-04-01 MED ORDER — SODIUM CHLORIDE 0.9% FLUSH: 3.0000 mL | Freq: Once | INTRAVENOUS | Status: DC

## 2019-04-01 NOTE — ED Triage Notes (Signed)
Pt here from home via [facility].  LSN 10:30 this am when family left the house pt was normal.  When they came back at 1530 pt had a L facial droop.  All s/s had resolved when [facility] arrived, but en-route, symptoms returned.  Pt has L facial droop, L sided neglect.

## 2019-04-01 NOTE — ED Notes (Signed)
Swallow screen not completed d/t pt going to IR.

## 2019-04-01 NOTE — ED Provider Notes (Signed)
[facility] EMERGENCY DEPARTMENT Provider Note   CSN: [id number] Arrival date & time: 04/01/19  1608  An emergency department physician performed an initial assessment on this suspected stroke patient at 51.  History   Chief Complaint Chief Complaint  Patient presents with   Code Stroke    HPI EDWARD TREVINO is a 85 y.o. female w/ hx of HTN, DM2, presenting from home with left sided weakness as a CODE STROKE.  Patient reportedly had last known well around 10 am today.  Granddaughter came to house around 3 pm and found the patient with left sided facial droop and left sided weakness.  EMS reports pt had no deficits on their arrival, but en route to the hospital she did develop left sided weakness and confusion.  Code STROKE activated.  Patient's airway was assessed and cleared on arrival, and she was taken directly to CT.  Family reported that prior to this, the patient had been living independently at home, and is highly functional.     HPI  Past Medical History:  Diagnosis Date   Bifascicular block 12/03/2016   Diabetes mellitus without complication ([place])    Hypertension    [place] I 12/03/2016    Patient Active Problem List   Diagnosis Date Noted   Mixed hyperlipidemia 08/28/2017   High risk medication use 08/28/2017   [place] I 12/03/2016   Bifascicular block 12/03/2016   Bradycardia 11/16/2016   Seasonal allergic rhinitis due to pollen 11/16/2016   Prediabetes 11/16/2016   Abnormal ECG 11/16/2016   Deformity of toe of left foot 11/16/2016   Breast tenderness in female 11/16/2016   Right-sided face pain 10/19/2014   Viral URI with cough 05/11/2014   Bilateral lower extremity edema 02/09/2014   Hymenoptera allergy 10/02/2013   Bilateral hand pain 06/16/2013   Cerumen impaction 06/16/2013   Elevated TSH 03/16/2013   Dizziness and giddiness 03/06/2013   Pain in right shoulder 03/06/2013   Vision changes 03/06/2013    GERD (gastroesophageal reflux disease) 09/17/2012   Facial droop 05/20/2012   Radicular pain in right arm 05/05/2012   Lip swelling 05/05/2012   Exposure to hepatitis A 05/05/2012   Nasal congestion with rhinorrhea 12/28/2011   Hypercholesterolemia 09/26/2011   Hypertension 08/21/2011    No past surgical history on file.   OB History   No obstetric history on file.      Home Medications    Prior to Admission medications   Medication Sig Start Date End Date Taking? Authorizing Provider  acetaminophen (TYLENOL 8 HOUR ARTHRITIS PAIN) 650 MG CR tablet Take 650 mg by mouth every 8 (eight) hours as needed for pain.    [provider]  cholecalciferol (VITAMIN D3) 25 MCG (1000 UT) tablet Take 1,000 Units by mouth daily.    [provider]  vitamin C (ASCORBIC ACID) 500 MG tablet Take 500 mg by mouth daily.    [provider]    Family History Family History  Problem Relation Age of Onset   Heart disease Mother    Heart attack Mother    Cancer Brother    Breast cancer Daughter    Breast cancer Maternal Aunt    Breast cancer Cousin     Social History Social History   Tobacco Use   Smoking status: Never Smoker   Smokeless tobacco: Never Used  Substance Use Topics   Alcohol use: No    Alcohol/week: 0.0 standard drinks   Drug use: No  Allergies   Patient has no known allergies.   Review of Systems Review of Systems  Unable to perform ROS: Acuity of condition     Physical Exam Updated Vital Signs BP (!) 171/80    Pulse 67    Temp 98.8 F (37.1 C)    Resp 18    Ht  (1.575 m)    Wt 76.4 kg    SpO2 100%    BMI 30.81 kg/m   Physical Exam Vitals signs and nursing note reviewed.  Constitutional:      General: She is not in acute distress.    Appearance: She is well-developed.  HENT:     Head: Normocephalic and atraumatic.  Eyes:     Conjunctiva/sclera: Conjunctivae normal.  Neck:     Musculoskeletal: Neck  supple.  Cardiovascular:     Rate and Rhythm: Normal rate and regular rhythm.     Pulses: Normal pulses.  Pulmonary:     Effort: Pulmonary effort is normal. No respiratory distress.  Abdominal:     Palpations: Abdomen is soft.     Tenderness: There is no abdominal tenderness.  Skin:    General: Skin is warm and dry.  Neurological:     Mental Status: She is alert and oriented to person, place, and time.     Comments: Left sided facial droop Left sided neglect Right extremities 5/5 strength Sensation preserved      ED Treatments / Results  Labs (all labs ordered are listed, but only abnormal results are displayed) Labs Reviewed  SARS CORONAVIRUS 2 BY RT PCR (HOSPITAL ORDER, PERFORMED IN [facility]) - Abnormal; Notable for the following components:      Result Value   SARS Coronavirus 2 POSITIVE (*)    All other components within normal limits  COMPREHENSIVE METABOLIC PANEL - Abnormal; Notable for the following components:   CO2 21 (*)    Glucose, Bld 147 (*)    Creatinine, Ser 1.21 (*)    GFR calc non Af Amer 39 (*)    GFR calc Af Amer 45 (*)    All other components within normal limits  APTT - Abnormal; Notable for the following components:   aPTT 22 (*)    All other components within normal limits  CBC WITH DIFFERENTIAL/PLATELET - Abnormal; Notable for the following components:   Hemoglobin 11.7 (*)    All other components within normal limits  I-STAT CHEM 8, ED - Abnormal; Notable for the following components:   Glucose, Bld 144 (*)    Calcium, Ion 1.11 (*)    All other components within normal limits  CBG MONITORING, ED - Abnormal; Notable for the following components:   Glucose-Capillary 135 (*)    All other components within normal limits  PROTIME-INR    EKG EKG Interpretation  Date/Time:  Wednesday April 01 2019 16:51:38 EST Ventricular Rate:  64 PR Interval:    QRS Duration: 138 QT Interval:  479 QTC Calculation: 495 R  Axis:   -66 Text Interpretation: Sinus rhythm Probable left atrial enlargement RBBB and LAFB Left ventricular hypertrophy Poor data quality in current ECG precludes serial comparison Confirmed by Pricilla Loveless [phone]) on 04/02/2019 10:02:20 AM   Radiology Ct Angio Head W Or Wo Contrast  Result Date: 04/01/2019 CLINICAL DATA:  Stroke code.  Left-sided facial droop and weakness. EXAM: CT ANGIOGRAPHY HEAD AND NECK CT PERFUSION BRAIN TECHNIQUE: Multidetector CT imaging of the head and neck was performed using the standard protocol during bolus  administration of intravenous contrast. Multiplanar CT image reconstructions and MIPs were obtained to evaluate the vascular anatomy. Carotid stenosis measurements (when applicable) are obtained utilizing NASCET criteria, using the distal internal carotid diameter as the denominator. Multiphase CT imaging of the brain was performed following IV bolus contrast injection. Subsequent parametric perfusion maps were calculated using RAPID software. CONTRAST:  60mL OMNIPAQUE IOHEXOL 350 MG/ML SOLN COMPARISON:  Noncontrast head CT performed earlier the same day 04/01/2019. FINDINGS: CTA NECK FINDINGS Aortic arch: Common origin of the innominate and left common carotid arteries. Scattered soft and calcified plaque within the visualized aortic arch and proximal major branch vessels of the neck. Right carotid system: CCA and ICA patent within the neck without stenosis. Mild calcified plaque at the carotid bifurcation and within the proximal ICA. Left carotid system: CCA and ICA patent within the neck without stenosis. There is tortuosity of the cervical ICA. Mild soft and calcified plaque within the distal CCA. Vertebral arteries: Codominant. Bilateral vertebral arteries patent within the neck without significant stenosis (50% or greater). Skeleton: Reversal of the expected cervical lordosis. Cervical spondylosis without high-grade bony spinal canal narrowing. Other neck: No neck  mass or cervical lymphadenopathy Upper chest: Imaged lung apices clear. Review of the MIP images confirms the above findings CTA HEAD FINDINGS Anterior circulation: The intracranial internal carotid arteries are patent with scattered soft and calcified plaque. No more than mild stenosis within the right ICA. Sites of up to moderate stenosis within the paraclinoid left ICA. The M1 right middle cerebral artery is patent with mild atherosclerotic irregularity. There is abrupt occlusion of a proximal M2 right MCA branch shortly beyond its origin. The A1 right anterior cerebral artery is patent and supplies the bilateral A2 and more distal anterior cerebral arteries. The left middle cerebral artery is patent without significant proximal stenosis. Hypoplastic or absent A1 left anterior cerebral artery. Posterior circulation: The intracranial vertebral arteries are patent. Sites of up to mild stenosis within the intracranial right vertebral artery. The basilar artery is patent without significant stenosis. Predominantly fetal origin of the right posterior cerebral artery, which is patent without significant proximal stenosis. The left posterior cerebral artery is patent without significant proximal stenosis. Venous sinuses: Within limitations of contrast timing, no convincing thrombus. Anatomic variants: As described Review of the MIP images confirms the above findings CT Brain Perfusion Findings: CBF (<30%) Volume: 9mL Perfusion (Tmax>6.0s) volume: 93mL Mismatch Volume: 84mL Infarction Location:Right MCA vascular territory These results were called by telephone at the time of interpretation on 04/01/2019 at 4:49 pm to provider Dr. Amada Jupiter, who verbally acknowledged these results. IMPRESSION: CTA neck: Bilateral common carotid, internal carotid and vertebral arteries patent within the neck without significant stenosis (50% or greater). Mild atherosclerotic disease as described. CTA head: 1. Abrupt occlusion of a  proximal M2 right MCA branch shortly beyond its origin. 2. Mild atherosclerotic irregularity of the M1 right middle cerebral artery. 3. Atherosclerotic disease within the intracranial internal carotid arteries with sites of up to mild stenosis on the right, and sites of up to mild/moderate stenosis on the left. CT perfusion head: Core infarct in the right MCA vascular territory measuring 9 mL. Hypoperfused parenchyma in the right MCA territory measuring 93 mL. Reported mismatch volume 84 mL. Electronically Signed   By: Jackey Loge DO   On: 04/01/2019 17:03   Ct Angio Neck W Or Wo Contrast  Result Date: 04/01/2019 CLINICAL DATA:  Stroke code.  Left-sided facial droop and weakness. EXAM: CT ANGIOGRAPHY HEAD AND NECK  CT PERFUSION BRAIN TECHNIQUE: Multidetector CT imaging of the head and neck was performed using the standard protocol during bolus administration of intravenous contrast. Multiplanar CT image reconstructions and MIPs were obtained to evaluate the vascular anatomy. Carotid stenosis measurements (when applicable) are obtained utilizing NASCET criteria, using the distal internal carotid diameter as the denominator. Multiphase CT imaging of the brain was performed following IV bolus contrast injection. Subsequent parametric perfusion maps were calculated using RAPID software. CONTRAST:  60mL OMNIPAQUE IOHEXOL 350 MG/ML SOLN COMPARISON:  Noncontrast head CT performed earlier the same day 04/01/2019. FINDINGS: CTA NECK FINDINGS Aortic arch: Common origin of the innominate and left common carotid arteries. Scattered soft and calcified plaque within the visualized aortic arch and proximal major branch vessels of the neck. Right carotid system: CCA and ICA patent within the neck without stenosis. Mild calcified plaque at the carotid bifurcation and within the proximal ICA. Left carotid system: CCA and ICA patent within the neck without stenosis. There is tortuosity of the cervical ICA. Mild soft and calcified  plaque within the distal CCA. Vertebral arteries: Codominant. Bilateral vertebral arteries patent within the neck without significant stenosis (50% or greater). Skeleton: Reversal of the expected cervical lordosis. Cervical spondylosis without high-grade bony spinal canal narrowing. Other neck: No neck mass or cervical lymphadenopathy Upper chest: Imaged lung apices clear. Review of the MIP images confirms the above findings CTA HEAD FINDINGS Anterior circulation: The intracranial internal carotid arteries are patent with scattered soft and calcified plaque. No more than mild stenosis within the right ICA. Sites of up to moderate stenosis within the paraclinoid left ICA. The M1 right middle cerebral artery is patent with mild atherosclerotic irregularity. There is abrupt occlusion of a proximal M2 right MCA branch shortly beyond its origin. The A1 right anterior cerebral artery is patent and supplies the bilateral A2 and more distal anterior cerebral arteries. The left middle cerebral artery is patent without significant proximal stenosis. Hypoplastic or absent A1 left anterior cerebral artery. Posterior circulation: The intracranial vertebral arteries are patent. Sites of up to mild stenosis within the intracranial right vertebral artery. The basilar artery is patent without significant stenosis. Predominantly fetal origin of the right posterior cerebral artery, which is patent without significant proximal stenosis. The left posterior cerebral artery is patent without significant proximal stenosis. Venous sinuses: Within limitations of contrast timing, no convincing thrombus. Anatomic variants: As described Review of the MIP images confirms the above findings CT Brain Perfusion Findings: CBF (<30%) Volume: 9mL Perfusion (Tmax>6.0s) volume: 93mL Mismatch Volume: 84mL Infarction Location:Right MCA vascular territory These results were called by telephone at the time of interpretation on 04/01/2019 at 4:49 pm to  provider Dr. Amada Jupiter, who verbally acknowledged these results. IMPRESSION: CTA neck: Bilateral common carotid, internal carotid and vertebral arteries patent within the neck without significant stenosis (50% or greater). Mild atherosclerotic disease as described. CTA head: 1. Abrupt occlusion of a proximal M2 right MCA branch shortly beyond its origin. 2. Mild atherosclerotic irregularity of the M1 right middle cerebral artery. 3. Atherosclerotic disease within the intracranial internal carotid arteries with sites of up to mild stenosis on the right, and sites of up to mild/moderate stenosis on the left. CT perfusion head: Core infarct in the right MCA vascular territory measuring 9 mL. Hypoperfused parenchyma in the right MCA territory measuring 93 mL. Reported mismatch volume 84 mL. Electronically Signed   By: Jackey Loge DO   On: 04/01/2019 17:03   Ct Cerebral Perfusion W Contrast  Result Date:  04/01/2019 CLINICAL DATA:  Stroke code.  Left-sided facial droop and weakness. EXAM: CT ANGIOGRAPHY HEAD AND NECK CT PERFUSION BRAIN TECHNIQUE: Multidetector CT imaging of the head and neck was performed using the standard protocol during bolus administration of intravenous contrast. Multiplanar CT image reconstructions and MIPs were obtained to evaluate the vascular anatomy. Carotid stenosis measurements (when applicable) are obtained utilizing NASCET criteria, using the distal internal carotid diameter as the denominator. Multiphase CT imaging of the brain was performed following IV bolus contrast injection. Subsequent parametric perfusion maps were calculated using RAPID software. CONTRAST:  60mL OMNIPAQUE IOHEXOL 350 MG/ML SOLN COMPARISON:  Noncontrast head CT performed earlier the same day 04/01/2019. FINDINGS: CTA NECK FINDINGS Aortic arch: Common origin of the innominate and left common carotid arteries. Scattered soft and calcified plaque within the visualized aortic arch and proximal major branch vessels  of the neck. Right carotid system: CCA and ICA patent within the neck without stenosis. Mild calcified plaque at the carotid bifurcation and within the proximal ICA. Left carotid system: CCA and ICA patent within the neck without stenosis. There is tortuosity of the cervical ICA. Mild soft and calcified plaque within the distal CCA. Vertebral arteries: Codominant. Bilateral vertebral arteries patent within the neck without significant stenosis (50% or greater). Skeleton: Reversal of the expected cervical lordosis. Cervical spondylosis without high-grade bony spinal canal narrowing. Other neck: No neck mass or cervical lymphadenopathy Upper chest: Imaged lung apices clear. Review of the MIP images confirms the above findings CTA HEAD FINDINGS Anterior circulation: The intracranial internal carotid arteries are patent with scattered soft and calcified plaque. No more than mild stenosis within the right ICA. Sites of up to moderate stenosis within the paraclinoid left ICA. The M1 right middle cerebral artery is patent with mild atherosclerotic irregularity. There is abrupt occlusion of a proximal M2 right MCA branch shortly beyond its origin. The A1 right anterior cerebral artery is patent and supplies the bilateral A2 and more distal anterior cerebral arteries. The left middle cerebral artery is patent without significant proximal stenosis. Hypoplastic or absent A1 left anterior cerebral artery. Posterior circulation: The intracranial vertebral arteries are patent. Sites of up to mild stenosis within the intracranial right vertebral artery. The basilar artery is patent without significant stenosis. Predominantly fetal origin of the right posterior cerebral artery, which is patent without significant proximal stenosis. The left posterior cerebral artery is patent without significant proximal stenosis. Venous sinuses: Within limitations of contrast timing, no convincing thrombus. Anatomic variants: As described Review  of the MIP images confirms the above findings CT Brain Perfusion Findings: CBF (<30%) Volume: 9mL Perfusion (Tmax>6.0s) volume: 93mL Mismatch Volume: 84mL Infarction Location:Right MCA vascular territory These results were called by telephone at the time of interpretation on 04/01/2019 at 4:49 pm to provider Dr. Amada JupiterKirkpatrick, who verbally acknowledged these results. IMPRESSION: CTA neck: Bilateral common carotid, internal carotid and vertebral arteries patent within the neck without significant stenosis (50% or greater). Mild atherosclerotic disease as described. CTA head: 1. Abrupt occlusion of a proximal M2 right MCA branch shortly beyond its origin. 2. Mild atherosclerotic irregularity of the M1 right middle cerebral artery. 3. Atherosclerotic disease within the intracranial internal carotid arteries with sites of up to mild stenosis on the right, and sites of up to mild/moderate stenosis on the left. CT perfusion head: Core infarct in the right MCA vascular territory measuring 9 mL. Hypoperfused parenchyma in the right MCA territory measuring 93 mL. Reported mismatch volume 84 mL. Electronically Signed   By: Ronaldo MiyamotoKyle  Golden DO   On: 04/01/2019 17:03   Ct Head Code Stroke Wo Contrast  Result Date: 04/01/2019 CLINICAL DATA:  Code stroke. Focal neuro deficit, less than 6 hours, stroke suspected. Left-sided facial droop and weakness. Slurred speech. EXAM: CT HEAD WITHOUT CONTRAST TECHNIQUE: Contiguous axial images were obtained from the base of the skull through the vertex without intravenous contrast. COMPARISON:  Brain MRI 05/21/2012, head CT 05/10/2008 FINDINGS: Brain: There is an acute cortical/subcortical infarct within the anterior right frontal lobe M1 MCA vascular territory measuring 2.4 x 1.8 cm in transaxial dimensions (series 2, image 6). Subtle focal loss of gray-white differentiation is also questioned within the right frontal lobe motor strip (M5 territory) (series 4, image 12). There is no evidence  of acute intracranial hemorrhage. No midline shift or extra-axial fluid collection. There is a background of moderate chronic small vessel ischemic disease. Mild generalized parenchymal atrophy. Vascular: No hyperdense vessel.  Atherosclerotic calcifications. Skull: Normal. Negative for fracture or focal lesion. Sinuses/Orbits: Visualized orbits demonstrate no acute abnormality. ASPECTS [facility] Stroke Program Early CT Score) - Ganglionic level infarction (caudate, lentiform nuclei, internal capsule, insula, M1-M3 cortex): 6 - Supraganglionic infarction (M4-M6 cortex): 2 Total score (0-10 with 10 being normal): 8 These results were called by telephone at the time of interpretation on 04/01/2019 at 4:48 pm to provider Dr. Amada Jupiter, who verbally acknowledged these results. IMPRESSION: Acute cortical/subcortical infarct within the anterior right frontal lobe. Additional subtle loss of gray-white differentiation is questioned within the right frontal lobe motor strip. ASPECTS 8. No intracranial hemorrhage. Background of moderate chronic small vessel ischemic disease. Electronically Signed   By: Jackey Loge DO   On: 04/01/2019 16:48    Procedures .Critical Care Performed by: Terald Sleeper, MD Authorized by: Terald Sleeper, MD   Critical care provider statement:    Critical care time (minutes):  30   Critical care was necessary to treat or prevent imminent or life-threatening deterioration of the following conditions:  CNS failure or compromise   Critical care was time spent personally by me on the following activities:  Discussions with consultants, evaluation of patient's response to treatment, examination of patient, ordering and performing treatments and interventions, ordering and review of laboratory studies, ordering and review of radiographic studies, pulse oximetry, re-evaluation of patient's condition, obtaining history from patient or surrogate and review of old charts Comments:      Stroke evaluation and diagnosis, requiring discussion with consultant, discussion with patient's family, and stabilization for urgent transfer to other hospital facility for intervention   (including critical care time)  Medications Ordered in ED Medications  iohexol (OMNIPAQUE) 350 MG/ML injection 60 mL (60 mLs Intravenous Contrast Given 04/01/19 1636)  iohexol (OMNIPAQUE) 350 MG/ML injection 40 mL (40 mLs Intravenous Contrast Given 04/01/19 1640)  0.9 %  sodium chloride infusion ( Intravenous Stopped 04/02/19 0523)     Initial Impression / Assessment and Plan / ED Course  I have reviewed the triage vital signs and the nursing notes.  Pertinent labs & imaging results that were available during my care of the patient were reviewed by me and considered in my medical decision making (see chart for details).  Code stroke, left sided weakness, last known well approx 10 am   Airway cleared on arrival, patient taken to CT with neurologist at bedside    Clinical Course as of Apr 01 1033  Wed Apr 01, 2019  1704 I was informed the neurology team our interventional team is currently involved in a  critical case, therefore the patient may need to be transferred to [facility].  The neurologist is reaching out to [facility].  The patient and her granddaughter made aware of this and the need for transfer, and they are in agreement   [MT]  1710 I spoke to the patient and her granddaughter about the risks and benefits of transfer to [facility].  They are in agreement for transfer.  I filled out the EMTALA form, and [facility] is prepared to transfer them now.  Our neurologist has spoken to their intervention team Dr. Clyda Hurdle who accepts   [MT]    Clinical Course User Index [MT] Latonja Bobeck, Kermit Balo, MD   This note was dictated using dragon dictation software.  Please be aware that there may be minor translation errors as a result of this oral dictation   Final Clinical Impressions(s) / ED Diagnoses   Final  diagnoses:  Cerebrovascular accident (CVA), unspecified mechanism [facility])    ED Discharge Orders    None       Terald Sleeper, MD 04/02/19 1035

## 2019-04-01 NOTE — ED Notes (Signed)
Call Granddaughter for updates.  Glennon Hamilton [phone].

## 2019-04-01 NOTE — Code Documentation (Signed)
85 yo female coming from home where she lives alone. Pt was LKW at 1030 when he family was with her and noted that she was not acting herself. Family left and came back around 1520 when she noted left facial droop, left arm weakness, and left leg weakness. EMS was called and patient seemed to resolved. While loading patient into the truck, pt had worsening of symptoms. Code Stroke activated. Stroke Team met patient upon arrival to the ED. Initial NIHSS 8 - See Stroke Timeline for specifics. Patient taken to CT. Deemed not a tPA candidate due being outside window. See MD Leonel Ramsay note for specifics. CTA/CTP completed. Pt needed to be taken to IR. IR Suite occupied with current patient. [facility] called by MD and [facility] truck notified. [facility] responded and transferred patient.

## 2019-04-01 NOTE — Consult Note (Signed)
Neurology Consultation  Reason for Consult: Code stroke Referring Physician: Myrtie Yang  CC: Left-sided deficits  History is obtained from: Daughter  HPI: Wendy Yang is a 85 y.o. female with history of hypertension, diabetes, Mobitz 1 heart block.  Patient was last known well at 10:30 AM on 04/01/2019.  Granddaughter came to see her at approximately 1520 and noticed left-sided facial droop and weakness.  EMS states that when they put her in the ambulance her symptoms had fully resolved however soon after at 1554 symptoms came back with left-sided droop and left-sided weakness.  Patient was brought immediately to [facility] as a code stroke.   ED course CT head/CTA head and neck/CT perfusion    LKW: 10:30 AM on 04/01/2019 tpa given?: no, out of window Premorbid modified Rankin scale (mRS): 0 NIH stroke scale 8  Past Medical History:  Diagnosis Date  . Bifascicular block 12/03/2016  . Diabetes mellitus without complication (HCC)   . Hypertension   . Mobitz I 12/03/2016   Family History  Problem Relation Age of Onset  . Heart disease Mother   . Heart attack Mother   . Cancer Brother   . Breast cancer Daughter   . Breast cancer Maternal Aunt   . Breast cancer Cousin    Social History:   reports that she has never smoked. She has never used smokeless tobacco. She reports that she does not drink alcohol or use drugs.  Medications  Current Facility-Administered Medications:  .  sodium chloride flush (NS) 0.9 % injection 3 mL, 3 mL, Intravenous, Once, Trifan, Wendy BaloMatthew J, MD  Current Outpatient Medications:  .  acetaminophen (TYLENOL 8 HOUR ARTHRITIS PAIN) 650 MG CR tablet, Take 650 mg by mouth every 8 (eight) hours as needed for pain., Disp: , Rfl:  .  cholecalciferol (VITAMIN D3) 25 MCG (1000 UT) tablet, Take 1,000 Units by mouth daily., Disp: , Rfl:  .  vitamin C (ASCORBIC ACID) 500 MG tablet, Take 500 mg by mouth daily., Disp: , Rfl:    Exam: Current vital  signs: Wt 76.4 kg   BMI 29.84 kg/m  Vital signs in last 24 hours: Weight:  [76.4 kg] 76.4 kg (11/25 1600)  ROS:   General ROS: negative for - chills, fatigue, fever, night sweats, weight gain or weight loss Psychological ROS: negative for - behavioral disorder, hallucinations, memory difficulties, mood swings or suicidal ideation Ophthalmic ROS: negative for - blurry vision, double vision, eye pain or loss of vision ENT ROS: negative for - epistaxis, nasal discharge, oral lesions, sore throat, tinnitus or vertigo Respiratory ROS: negative for - cough, hemoptysis, shortness of breath or wheezing Cardiovascular ROS: negative for - chest pain, dyspnea on exertion, edema or irregular heartbeat Gastrointestinal ROS: negative for - abdominal pain, diarrhea, hematemesis, nausea/vomiting or stool incontinence Genito-Urinary ROS: negative for - dysuria, hematuria, incontinence or urinary frequency/urgency Musculoskeletal ROS: negative for - joint swelling or muscular weakness Neurological ROS: as noted in HPI Dermatological ROS: negative for rash and skin lesion changes   Physical Exam   Constitutional: Appears well-developed and well-nourished.  Psych: Affect appropriate to situation Eyes: No scleral injection HENT: No OP obstrucion Head: Normocephalic.  Cardiovascular: Normal rate and regular rhythm.  Respiratory: Effort normal, non-labored breathing GI: Soft.  No distension. There is no tenderness.  Skin: WDI  Neuro: Mental Status: Patient is awake, alert, knows age and month Positive left-sided neglect Cranial Nerves: II: Visual Fields are full.  III,IV, VI: EOMI without ptosis or diploplia. Pupils equal,  round and reactive to light V: Facial sensation is symmetric to temperature VII: Left-sided facial droop VIII: hearing is intact to voice XII: tongue is midline without atrophy or fasciculations.  Motor: Tone is normal. Bulk is normal. 5/5 strength was present right arm and  leg  Sensory: Sensation is symmetric to light touch and temperature in the arms and legs. Neglect of left DSS Deep Tendon Reflexes: 2+ and symmetric in the biceps no AK or KJ Plantars: Toes are downgoing bilaterally.  Cerebellar: FNF and HKS are intact bilaterally  Labs I have reviewed labs in epic and the results pertinent to this consultation are:   CBC    Component Value Date/Time   WBC 4.0 01/06/2019 1115   RBC 3.79 (L) 01/06/2019 1115   HGB 11.4 (L) 01/06/2019 1115   HGB 11.2 12/01/2014 1019   HGB 12.9 09/01/2009 1400   HCT 35.0 01/06/2019 1115   HCT 35.1 12/01/2014 1019   HCT 38.5 09/01/2009 1400   PLT 194 01/06/2019 1115   PLT 202 12/01/2014 1019   MCV 92.3 01/06/2019 1115   MCV 92 12/01/2014 1019   MCV 92.9 09/01/2009 1400   MCH 30.1 01/06/2019 1115   MCHC 32.6 01/06/2019 1115   RDW 13.0 01/06/2019 1115   RDW 15.0 12/01/2014 1019   RDW 14.6 (H) 09/01/2009 1400   LYMPHSABS 1,612 01/06/2019 1115   LYMPHSABS 1.9 12/01/2014 1019   LYMPHSABS 3.2 09/01/2009 1400   MONOABS 468 11/12/2016 0852   MONOABS 0.5 09/01/2009 1400   EOSABS 32 01/06/2019 1115   EOSABS 0.1 12/01/2014 1019   BASOSABS 12 01/06/2019 1115   BASOSABS 0.0 12/01/2014 1019   BASOSABS 0.0 09/01/2009 1400    CMP     Component Value Date/Time   NA 143 11/14/2018 0921   NA 146 (H) 12/01/2014 1019   K 5.0 11/14/2018 0921   CL 107 11/14/2018 0921   CO2 28 11/14/2018 0921   GLUCOSE 106 (H) 11/14/2018 0921   BUN 14 11/14/2018 0921   BUN 14 12/01/2014 1019   CREATININE 1.06 (H) 11/14/2018 0921   CALCIUM 9.8 11/14/2018 0921   PROT 6.7 11/14/2018 0921   PROT 6.6 12/01/2014 1019   ALBUMIN 4.0 11/12/2016 0852   ALBUMIN 4.1 12/01/2014 1019   AST 15 11/14/2018 0921   ALT 6 11/14/2018 0921   ALKPHOS 98 11/12/2016 0852   BILITOT 0.6 11/14/2018 0921   BILITOT 0.3 12/01/2014 1019   GFRNONAA 46 (L) 11/14/2018 0921   GFRAA 53 (L) 11/14/2018 0921    Lipid Panel     Component Value Date/Time   CHOL  270 (H) 11/14/2018 0921   TRIG 80 11/14/2018 0921   HDL 76 11/14/2018 0921   CHOLHDL 3.6 11/14/2018 0921   VLDL 13 11/12/2016 0852   LDLCALC 175 (H) 11/14/2018 0921   LDLDIRECT 102 (H) 12/28/2011 1051     Imaging I have reviewed the images obtained:  CT-scan of the brain--Acute cortical/subcortical infarct within the anterior right frontal lobe. Additional subtle loss of gray-white differentiation is questioned within the right frontal lobe motor strip. ASPECTS 8. No intracranial hemorrhage. Background of moderate chronic small vessel ischemic disease.  CTA head:  1. Abrupt occlusion of a proximal M2 right MCA branch shortly beyond its origin. 2. Mild atherosclerotic irregularity of the M1 right middle cerebral artery. 3. Atherosclerotic disease within the intracranial internal carotid arteries with sites of up to mild stenosis on the right, and sites of up to mild/moderate stenosis on the left.  CTA  neck:  Bilateral common carotid, internal carotid and vertebral arteries patent within the neck without significant stenosis (50% or greater). Mild atherosclerotic disease as described.  CT perfusion head:  Core infarct in the right MCA vascular territory measuring 9 mL. Hypoperfused parenchyma in the right MCA territory measuring 93 mL. Reported mismatch volume 84 mL.   Felicie Morn PA-C [facility] [phone]  M-F  (9:00 am- 5:00 PM)  04/01/2019, 4:21 PM   I have seen the patient and reviewed the above note.   Assessment:  85 YO female presenting with waxing and waning left sided hemineglect and weakness.  CTA  With right M2 MCA branch occlusion with core infarct 67mL and large penumbra. Though she is of advanced age, she is very independent and therefore I think that intervention should be considered.  Due to IR already in procedure patient will need to be emergently transferred to [facility] for consideration of IR intervention   Plan: Transfer to  [facility] IR for intervention.    This patient is critically ill and at significant risk of neurological worsening, death and care requires constant monitoring of vital signs, hemodynamics,respiratory and cardiac monitoring, neurological assessment, discussion with family, other specialists and medical decision making of high complexity. I spent 65 minutes of neurocritical care time  in the care of  this patient. This was time spent independent of any time provided by nurse practitioner or PA.  Ritta Slot, MD [facility] [phone]  If 7pm- 7am, please page neurology on call as listed in AMION. 04/01/2019  5:49 PM

## 2019-05-08 DEATH — deceased

## 2020-01-08 ENCOUNTER — Ambulatory Visit: Payer: Medicare Other | Admitting: Family

## 2020-08-27 IMAGING — CT CT HEAD CODE STROKE
3 of 5 series · 12 of 40 positions shown, 14 images · non-contrast
Comparison: Brain MRI 05/21/2012, head CT 05/10/2008

CLINICAL DATA: Code stroke. Focal neuro deficit, less than 6 hours,
stroke suspected. Left-sided facial droop and weakness. Slurred
speech.

EXAM:
CT HEAD WITHOUT CONTRAST
TECHNIQUE: Contiguous axial images were obtained from the base of the skull
through the vertex without intravenous contrast.

[Series 2: head 5.0 mpr ax · axial · 0.35mm/px · z∈[-117,-38]mm · 5 of 24 slices shown, 7 images]
[im 4/24  brain]
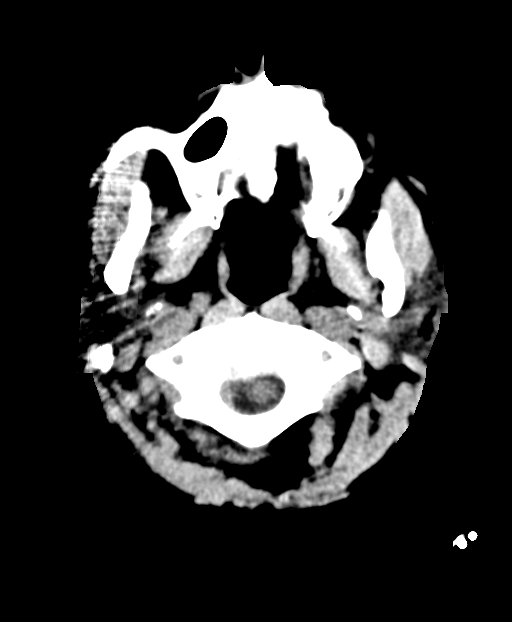
[im 4/24  bone]
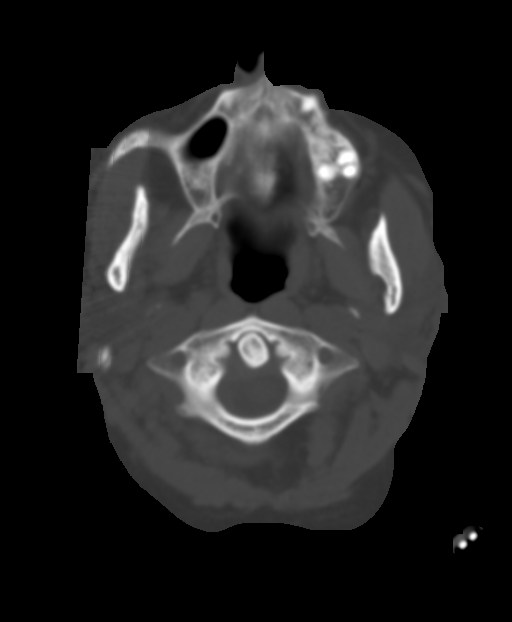
[im 8/24  brain]
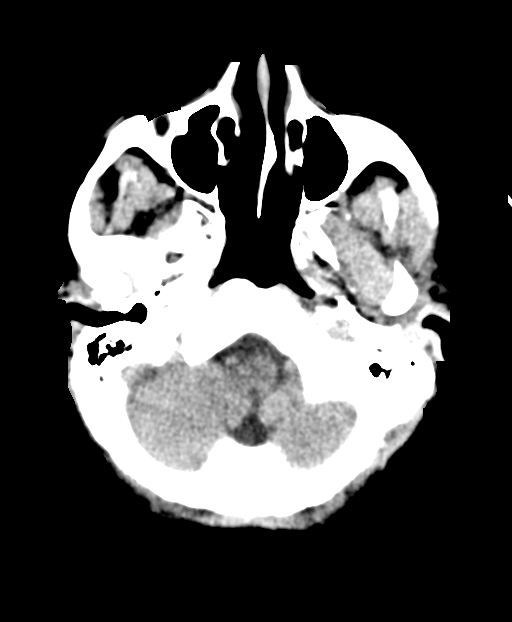
[im 12/24  brain]
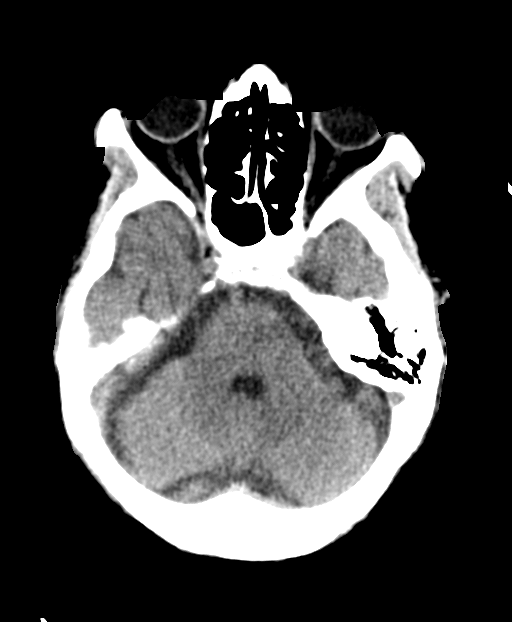
[im 16/24  brain]
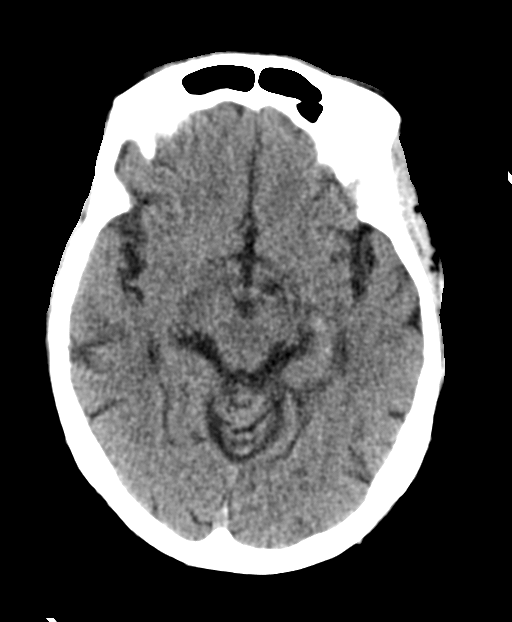
[im 20/24  brain]
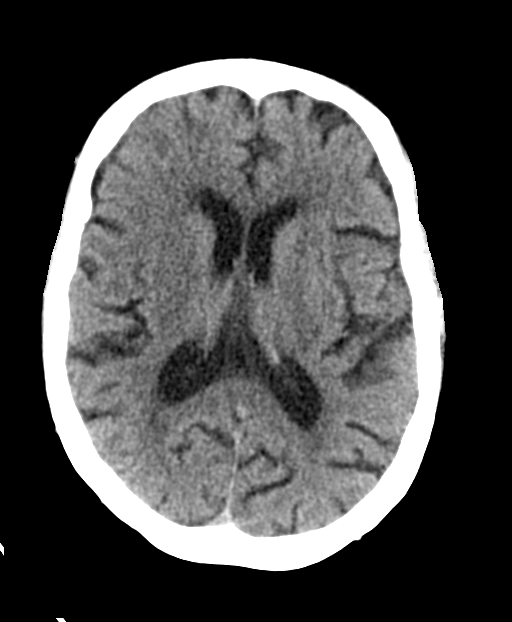
[im 20/24  bone]
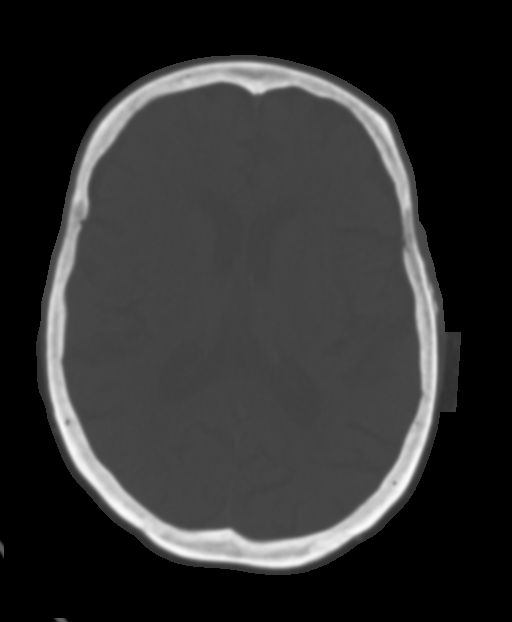

[Series 5: head 2.0 h70h · axial · 0.44mm/px · z∈[-7,+21]mm · 4 of 39 slices shown]
[im 4/39  brain]
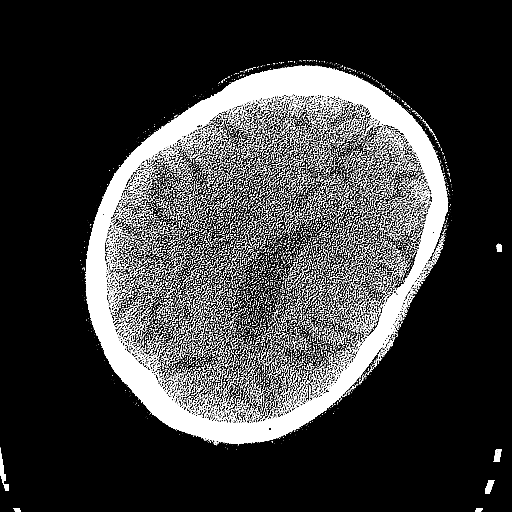
[im 7/39  brain]
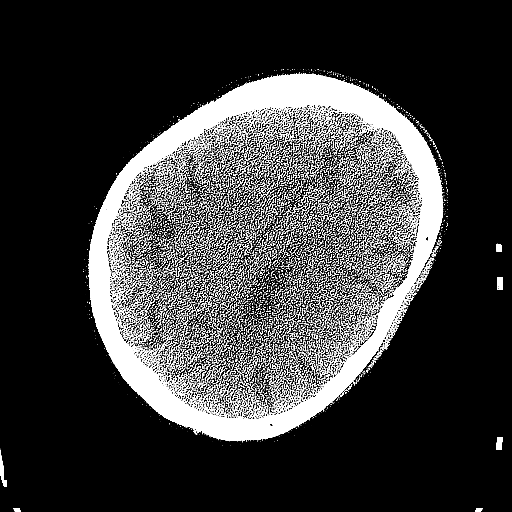
[im 14/39  brain]
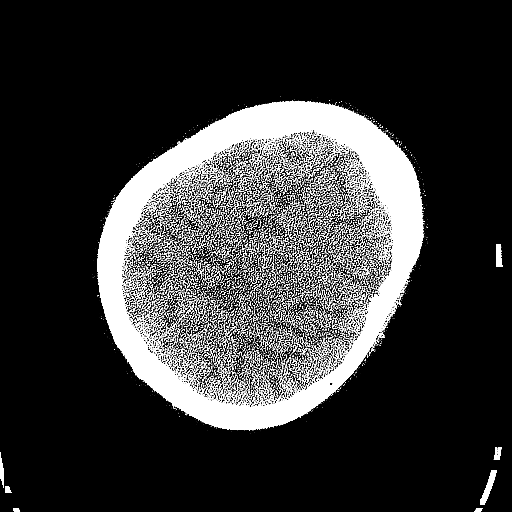
[im 18/39  brain]
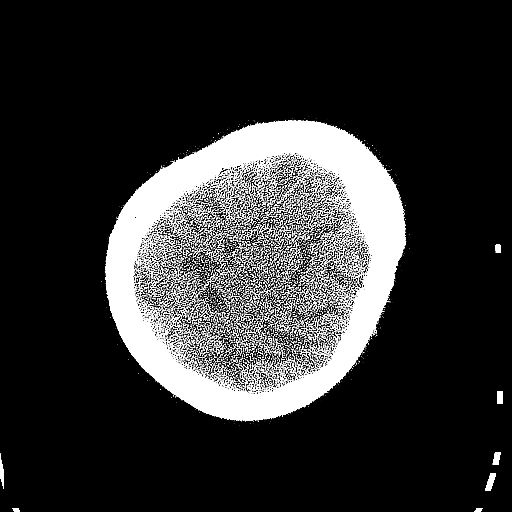

[Series 6: head 3.0 mpr cor · coronal · 0.16mm/px · 3 of 63 slices shown]
[im 21/63  brain]
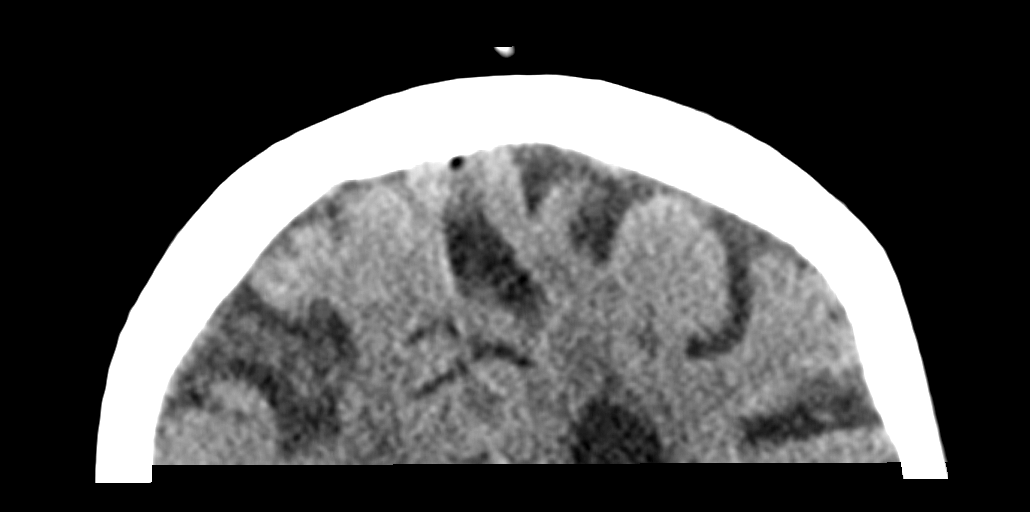
[im 28/63  brain]
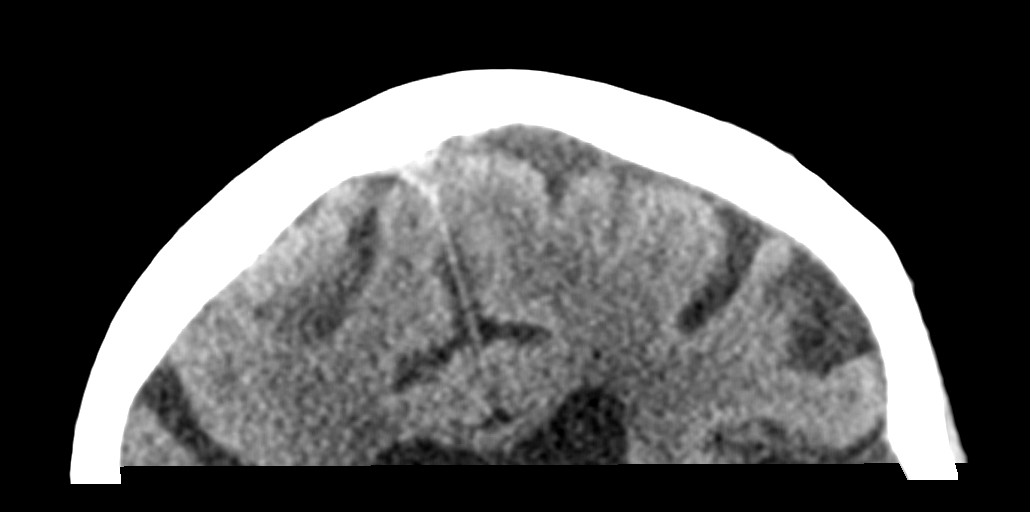
[im 35/63  brain]
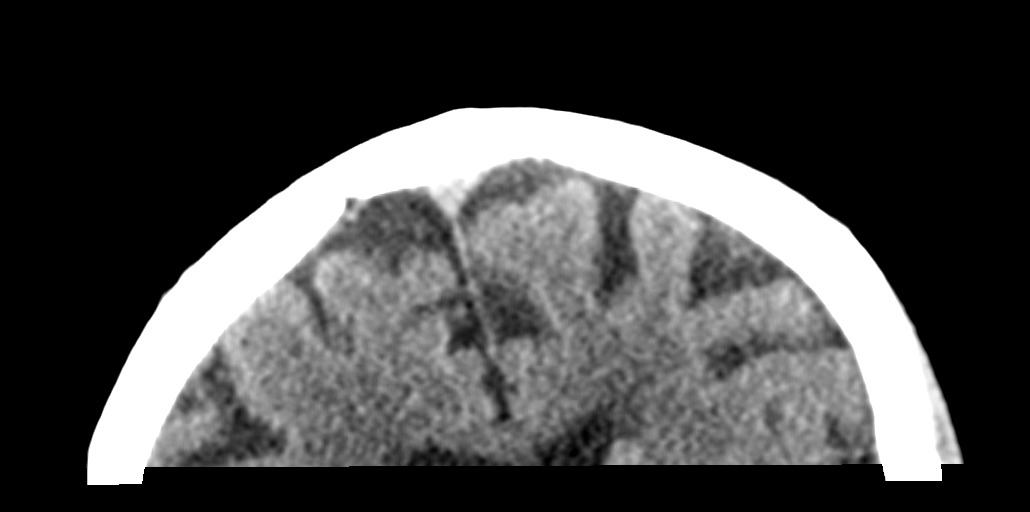

[12 of 40 positions shown; findings below may reference images not displayed]

FINDINGS: Brain:

There is an acute cortical/subcortical infarct within the anterior
right frontal lobe M1 MCA vascular territory measuring 2.4 x 1.8 cm
in transaxial dimensions (series 2, image 6). Subtle focal loss of
gray-white differentiation is also questioned within the right
frontal lobe motor strip (M5 territory) (series 4, image 12).

There is no evidence of acute intracranial hemorrhage. No midline
shift or extra-axial fluid collection. There is a background of
moderate chronic small vessel ischemic disease.

Mild generalized parenchymal atrophy.

Vascular: No hyperdense vessel.  Atherosclerotic calcifications.

Skull: Normal. Negative for fracture or focal lesion.

Sinuses/Orbits: Visualized orbits demonstrate no acute abnormality.

ASPECTS (Alberta Stroke Program Early CT Score)

- Ganglionic level infarction (caudate, lentiform nuclei, internal
capsule, insula, M1-M3 cortex): 6

- Supraganglionic infarction (M4-M6 cortex): 2

Total score (0-10 with 10 being normal): 8

These results were called by telephone at the time of interpretation
on 04/01/2019 at [DATE] to provider Dr. Sule, who verbally
acknowledged these results.
IMPRESSION: Acute cortical/subcortical infarct within the anterior right frontal
lobe. Additional subtle loss of gray-white differentiation is
questioned within the right frontal lobe motor strip. ASPECTS 8.

No intracranial hemorrhage.

Background of moderate chronic small vessel ischemic disease.
